# Patient Record
Sex: Male | Born: 1935 | Race: White | Hispanic: No | Marital: Single | Smoking: Never smoker
Health system: Southern US, Community
[De-identification: ages and names within clinical notes are randomized; demographics above are authoritative.]

## PROBLEM LIST (undated history)

## (undated) DIAGNOSIS — E119 Type 2 diabetes mellitus without complications: Secondary | ICD-10-CM

## (undated) DIAGNOSIS — C9110 Chronic lymphocytic leukemia of B-cell type not having achieved remission: Secondary | ICD-10-CM

## (undated) DIAGNOSIS — I10 Essential (primary) hypertension: Secondary | ICD-10-CM

## (undated) HISTORY — PX: TONSILLECTOMY: SUR1361

---

## 2003-12-17 ENCOUNTER — Ambulatory Visit: Payer: Self-pay | Admitting: Internal Medicine

## 2004-03-17 ENCOUNTER — Ambulatory Visit: Payer: Self-pay | Admitting: Internal Medicine

## 2004-06-18 ENCOUNTER — Ambulatory Visit: Payer: Self-pay | Admitting: Internal Medicine

## 2004-09-22 ENCOUNTER — Ambulatory Visit: Payer: Self-pay | Admitting: Internal Medicine

## 2005-01-20 ENCOUNTER — Ambulatory Visit: Payer: Self-pay | Admitting: Internal Medicine

## 2005-07-13 ENCOUNTER — Ambulatory Visit: Payer: Self-pay | Admitting: Internal Medicine

## 2005-07-22 LAB — CBC WITH DIFFERENTIAL/PLATELET
BASO%: 0.5 % (ref 0.0–2.0)
EOS%: 0.2 % (ref 0.0–7.0)
HCT: 42.8 % (ref 38.7–49.9)
MCH: 31.9 pg (ref 28.0–33.4)
MCHC: 33.8 g/dL (ref 32.0–35.9)
NEUT%: 21.3 % — ABNORMAL LOW (ref 40.0–75.0)
RBC: 4.53 10*6/uL (ref 4.20–5.71)
RDW: 14.5 % (ref 11.2–14.6)
lymph#: 17.3 10*3/uL — ABNORMAL HIGH (ref 0.9–3.3)

## 2006-01-15 ENCOUNTER — Ambulatory Visit: Payer: Self-pay | Admitting: Internal Medicine

## 2006-01-20 LAB — CBC WITH DIFFERENTIAL/PLATELET
EOS%: 0.2 % (ref 0.0–7.0)
Eosinophils Absolute: 0 10*3/uL (ref 0.0–0.5)
MCV: 95.8 fL (ref 81.6–98.0)
MONO%: 5.3 % (ref 0.0–13.0)
NEUT#: 5.1 10*3/uL (ref 1.5–6.5)
RBC: 4.5 10*6/uL (ref 4.20–5.71)
RDW: 13.3 % (ref 11.2–14.6)

## 2006-01-20 LAB — LACTATE DEHYDROGENASE: LDH: 102 U/L (ref 94–250)

## 2006-01-20 LAB — TECHNOLOGIST REVIEW

## 2016-10-16 ENCOUNTER — Emergency Department: Payer: Medicare Other

## 2016-10-16 ENCOUNTER — Emergency Department
Admission: EM | Admit: 2016-10-16 | Discharge: 2016-10-16 | Disposition: A | Discharge status: Home / Self Care | Payer: Medicare Other | Attending: Emergency Medicine | Admitting: Emergency Medicine

## 2016-10-16 ENCOUNTER — Encounter: Payer: Self-pay | Admitting: Emergency Medicine

## 2016-10-16 DIAGNOSIS — Y929 Unspecified place or not applicable: Secondary | ICD-10-CM | POA: Insufficient documentation

## 2016-10-16 DIAGNOSIS — S32010A Wedge compression fracture of first lumbar vertebra, initial encounter for closed fracture: Secondary | ICD-10-CM | POA: Diagnosis not present

## 2016-10-16 DIAGNOSIS — X500XXA Overexertion from strenuous movement or load, initial encounter: Secondary | ICD-10-CM | POA: Diagnosis not present

## 2016-10-16 DIAGNOSIS — Y999 Unspecified external cause status: Secondary | ICD-10-CM | POA: Insufficient documentation

## 2016-10-16 DIAGNOSIS — S3992XA Unspecified injury of lower back, initial encounter: Secondary | ICD-10-CM | POA: Diagnosis present

## 2016-10-16 DIAGNOSIS — Y9389 Activity, other specified: Secondary | ICD-10-CM | POA: Insufficient documentation

## 2016-10-16 DIAGNOSIS — Z9181 History of falling: Secondary | ICD-10-CM | POA: Diagnosis not present

## 2016-10-16 DIAGNOSIS — E119 Type 2 diabetes mellitus without complications: Secondary | ICD-10-CM | POA: Diagnosis not present

## 2016-10-16 DIAGNOSIS — I10 Essential (primary) hypertension: Secondary | ICD-10-CM | POA: Diagnosis not present

## 2016-10-16 DIAGNOSIS — F0390 Unspecified dementia without behavioral disturbance: Secondary | ICD-10-CM | POA: Diagnosis not present

## 2016-10-16 HISTORY — DX: Type 2 diabetes mellitus without complications: E11.9

## 2016-10-16 HISTORY — DX: Essential (primary) hypertension: I10

## 2016-10-16 LAB — URINALYSIS, COMPLETE (UACMP) WITH MICROSCOPIC
Bacteria, UA: NONE SEEN
Bilirubin Urine: NEGATIVE
Glucose, UA: NEGATIVE mg/dL
Ketones, ur: NEGATIVE mg/dL
LEUKOCYTES UA: NEGATIVE
NITRITE: NEGATIVE
PH: 5 (ref 5.0–8.0)
Protein, ur: NEGATIVE mg/dL
SPECIFIC GRAVITY, URINE: 1.01 (ref 1.005–1.030)

## 2016-10-16 LAB — COMPREHENSIVE METABOLIC PANEL
ALBUMIN: 3.7 g/dL (ref 3.5–5.0)
ALK PHOS: 72 U/L (ref 38–126)
ALT: 15 U/L — ABNORMAL LOW (ref 17–63)
ANION GAP: 9 (ref 5–15)
AST: 23 U/L (ref 15–41)
BILIRUBIN TOTAL: 0.6 mg/dL (ref 0.3–1.2)
BUN: 28 mg/dL — ABNORMAL HIGH (ref 6–20)
CALCIUM: 8.9 mg/dL (ref 8.9–10.3)
CO2: 21 mmol/L — ABNORMAL LOW (ref 22–32)
Chloride: 102 mmol/L (ref 101–111)
Creatinine, Ser: 1.36 mg/dL — ABNORMAL HIGH (ref 0.61–1.24)
GFR, EST AFRICAN AMERICAN: 49 mL/min — AB (ref >60)
GFR, EST NON AFRICAN AMERICAN: 43 mL/min — AB (ref >60)
Glucose, Bld: 140 mg/dL — ABNORMAL HIGH (ref 65–99)
POTASSIUM: 5.1 mmol/L (ref 3.5–5.1)
Sodium: 132 mmol/L — ABNORMAL LOW (ref 135–145)
TOTAL PROTEIN: 6.7 g/dL (ref 6.5–8.1)

## 2016-10-16 NOTE — ED Triage Notes (Signed)
Pt reports that his lower back is hurt. He states that it hurts when he gets up from bed and moving positions. He reports that he did fall a few days ago.

## 2016-10-16 NOTE — Progress Notes (Signed)
Clinical Education officer, museum (CSW) received verbal consult from Dr. Corky Downs to speak to patient and his family about placement options. Per Dr. Corky Downs patient will D/C home from the ED. CSW attempted to meet with patient and his family however they had already left before CSW could meet with them. RN case manager aware of above.   [employer], LCSW [phone]

## 2016-10-16 NOTE — ED Provider Notes (Signed)
[facility] Emergency Department Provider Note   ____________________________________________    I have reviewed the triage vital signs and the nursing notes.   HISTORY  Chief Complaint Back Pain   History limited by mild dementia, some history provided by family  HPI Frank Owens is a 83 y.o. male Who presents with complaints of back pain.patient reports her last 2-3 days he has had pain in his right lower back he thinks he may have pulled a muscle lifting something heavy. He reports he did have a fall over a week ago but the pain started 2-3 days ago. He has taken nothing for this. No dysuria, no incontinence. No focal deficits. No abdominal pain nausea or vomiting. He does not know his medical history.    Past Medical History:  Diagnosis Date  . Diabetes mellitus without complication ([place])   . Hypertension     There are no active problems to display for this patient.   Past Surgical History:  Procedure Laterality Date  . TONSILLECTOMY      Prior to Admission medications   Not on File     Allergies Patient has no known allergies.  History reviewed. No pertinent family history.  Social History Social History  Substance Use Topics  . Smoking status: Never Smoker  . Smokeless tobacco: Not on file  . Alcohol use No    Review of Systems  Constitutional: No fever/chills Eyes: No visual changes.  ENT: No neck pain Cardiovascular: Denies chest pain. Respiratory: Denies shortness of breath. Gastrointestinal: No abdominal pain.   Genitourinary: Negative for dysuria. Musculoskeletal: as above. Skin: Negative for rash. Neurological: Negative for focal weakness   ____________________________________________   PHYSICAL EXAM:  VITAL SIGNS: ED Triage Vitals  Enc Vitals Group     BP 10/16/16 1105 114/61     Pulse Rate 10/16/16 1230 (!) 59     Resp 10/16/16 1105 20     Temp 10/16/16 1105 97.8 F (36.6 C)     Temp Source  10/16/16 1105 Oral     SpO2 10/16/16 1105 100 %     Weight 10/16/16 1106 79.4 kg (175 lb)     Height 10/16/16 1106 1.727 m (5\' 8" )     Head Circumference --      Peak Flow --      Pain Score 10/16/16 1105 5     Pain Loc --      Pain Edu? --      Excl. in [place]? --     Constitutional: Alert. No acute distress.  Eyes: Conjunctivae are normal.  Head: Atraumatic.  Mouth/Throat: Mucous membranes are moist.   Neck:  Painless ROM Cardiovascular: Normal rate, regular rhythm.   Good peripheral circulation. Respiratory: Normal respiratory effort.  No retractions.  Gastrointestinal: Soft and nontender. No distention.  No CVA tenderness. Genitourinary: deferred Musculoskeletal: Back: mild paraspinal ttp right lower back, point ttp in this location.  Warm and well perfused Neurologic:  Normal speech and language. No gross focal neurologic deficits are appreciated.  Normal strength in the lower extremity.  Skin:  Skin is warm, dry and intact. No rash noted. Psychiatric: Mood and affect are normal. Speech and behavior are normal.  ____________________________________________   LABS (all labs ordered are listed, but only abnormal results are displayed)  Labs Reviewed  CBC WITH DIFFERENTIAL/PLATELET - Abnormal; Notable for the following:       Result Value   WBC 114.0 (*)    RBC 3.94 (*)  Hemoglobin 12.7 (*)    HCT 38.8 (*)    Neutro Abs 1.1 (*)    Lymphs Abs 112.9 (*)    Monocytes Absolute 0.0 (*)    All other components within normal limits  COMPREHENSIVE METABOLIC PANEL - Abnormal; Notable for the following:    Sodium 132 (*)    CO2 21 (*)    Glucose, Bld 140 (*)    BUN 28 (*)    Creatinine, Ser 1.36 (*)    ALT 15 (*)    GFR calc non Af Amer 43 (*)    GFR calc Af Amer 49 (*)    All other components within normal limits  URINALYSIS, COMPLETE (UACMP) WITH MICROSCOPIC - Abnormal; Notable for the following:    Color, Urine YELLOW (*)    APPearance CLEAR (*)    Hgb urine dipstick  MODERATE (*)    Squamous Epithelial / LPF 0-5 (*)    All other components within normal limits   ____________________________________________  EKG   ____________________________________________  RADIOLOGY  L1 compression fx ____________________________________________   PROCEDURES  Procedure(s) performed: No    Critical Care performed: No ____________________________________________   INITIAL IMPRESSION / ASSESSMENT AND PLAN / ED COURSE  Pertinent labs & imaging results that were available during my care of the patient were reviewed by me and considered in my medical decision making (see chart for details).  Patient well appearing and in no acute distress. Markedly elevated wbc. D/W PCP who notes hx of CLL >10 years. decision to not treat by family. Do not feel this is related to presentation today.   Diff dx: MS back pain, uti, fx  Patient with mild dementia apparently lives alone with heavy family support. Case manager spoke with family regarding home health which they declined.     ____________________________________________   FINAL CLINICAL IMPRESSION(S) / ED DIAGNOSES  Final diagnoses:  Closed compression fracture of first lumbar vertebra, initial encounter ([place])      NEW MEDICATIONS STARTED DURING THIS VISIT:  There are no discharge medications for this patient.    Note:  This document was prepared using Dragon voice recognition software and may include unintentional dictation errors.    Lavonia Drafts, MD 10/16/16 [phone]

## 2016-10-16 NOTE — Care Management Note (Signed)
Case Management Note  Patient Details  Name: TALVIN CHRISTIANSON MRN: [id number] Date of Birth: Apr 14, 1919  Subjective/Objective:   Asked by Dr Corky Downs to see the patient and family who are at bedside. After getting permission from the aptient to talk about his care in fron t of the family I proceeded to,let them know the MD had asked me to  Talk to them about HH P.T. And a Education officer, museum. The family, especially the nieces , are strongly against this. They say they have been looking into options for placing the patient as he is unable to care for himself. I explained that the PT eval. Might be helpful in establishing level of function, as well as offering some therapy for the patient and they have declined. They are aware according to them of the fact that a personal care service would cost out of pocket. They are adamant that they know which  Facility they want him to go to, and will continue to work toward getting the patient placed there themselves. I have also explained that having a [place] CSW could help them, and at this point they want to go home.  I have advised Dr Corky Downs of the conversation. The patient will be discharged.                Action/Plan:   Expected Discharge Date:                  Expected Discharge Plan:     In-House Referral:     Discharge planning Services     Post Acute Care Choice:    Choice offered to:     DME Arranged:    DME Agency:     HH Arranged:    HH Agency:     Status of Service:     If discussed at [employer] of Stay Meetings, dates discussed:    Additional Comments:  Beau Fanny, RN 10/16/2016, 2:13 PM

## 2016-10-23 LAB — CBC WITH DIFFERENTIAL/PLATELET
BASOS PCT: 0 %
Basophils Absolute: 0 10*3/uL (ref 0–0.1)
EOS ABS: 0 10*3/uL (ref 0–0.7)
EOS PCT: 0 %
HEMATOCRIT: 38.8 % — AB (ref 40.0–52.0)
Hemoglobin: 12.7 g/dL — ABNORMAL LOW (ref 13.0–18.0)
LYMPHS PCT: 99 %
Lymphs Abs: 112.9 10*3/uL — ABNORMAL HIGH (ref 1.0–3.6)
MCH: 32.1 pg (ref 26.0–34.0)
MCHC: 32.7 g/dL (ref 32.0–36.0)
MCV: 98.3 fL (ref 80.0–100.0)
Monocytes Absolute: 0 10*3/uL — ABNORMAL LOW (ref 0.2–1.0)
Monocytes Relative: 0 %
NEUTROS ABS: 1.1 10*3/uL — AB (ref 1.4–6.5)
Neutrophils Relative %: 1 %
Platelets: 194 10*3/uL (ref 150–440)
RBC: 3.94 MIL/uL — ABNORMAL LOW (ref 4.40–5.90)
RDW: 14.3 % (ref 11.5–14.5)
WBC: 114 10*3/uL (ref 4.0–10.5)

## 2016-10-31 ENCOUNTER — Emergency Department (HOSPITAL_COMMUNITY): Payer: Medicare Other

## 2016-10-31 ENCOUNTER — Inpatient Hospital Stay (HOSPITAL_COMMUNITY)
Admission: EM | Admit: 2016-10-31 | Discharge: 2016-11-04 | DRG: 309 | Disposition: A | Discharge status: Skilled Nursing Facility | Payer: Medicare Other | Attending: Internal Medicine | Admitting: Internal Medicine

## 2016-10-31 ENCOUNTER — Encounter (HOSPITAL_COMMUNITY): Payer: Self-pay

## 2016-10-31 DIAGNOSIS — L8990 Pressure ulcer of unspecified site, unspecified stage: Secondary | ICD-10-CM | POA: Insufficient documentation

## 2016-10-31 DIAGNOSIS — D72820 Lymphocytosis (symptomatic): Secondary | ICD-10-CM | POA: Diagnosis not present

## 2016-10-31 DIAGNOSIS — C9110 Chronic lymphocytic leukemia of B-cell type not having achieved remission: Secondary | ICD-10-CM | POA: Diagnosis present

## 2016-10-31 DIAGNOSIS — N183 Chronic kidney disease, stage 3 (moderate): Secondary | ICD-10-CM | POA: Diagnosis present

## 2016-10-31 DIAGNOSIS — E1122 Type 2 diabetes mellitus with diabetic chronic kidney disease: Secondary | ICD-10-CM | POA: Diagnosis present

## 2016-10-31 DIAGNOSIS — R001 Bradycardia, unspecified: Secondary | ICD-10-CM | POA: Diagnosis not present

## 2016-10-31 DIAGNOSIS — I10 Essential (primary) hypertension: Secondary | ICD-10-CM | POA: Diagnosis not present

## 2016-10-31 DIAGNOSIS — T447X5A Adverse effect of beta-adrenoreceptor antagonists, initial encounter: Secondary | ICD-10-CM | POA: Diagnosis present

## 2016-10-31 DIAGNOSIS — I495 Sick sinus syndrome: Secondary | ICD-10-CM | POA: Diagnosis not present

## 2016-10-31 DIAGNOSIS — I129 Hypertensive chronic kidney disease with stage 1 through stage 4 chronic kidney disease, or unspecified chronic kidney disease: Secondary | ICD-10-CM | POA: Diagnosis present

## 2016-10-31 DIAGNOSIS — T461X5A Adverse effect of calcium-channel blockers, initial encounter: Secondary | ICD-10-CM | POA: Diagnosis present

## 2016-10-31 DIAGNOSIS — E875 Hyperkalemia: Secondary | ICD-10-CM | POA: Diagnosis present

## 2016-10-31 DIAGNOSIS — E785 Hyperlipidemia, unspecified: Secondary | ICD-10-CM | POA: Diagnosis present

## 2016-10-31 DIAGNOSIS — N179 Acute kidney failure, unspecified: Secondary | ICD-10-CM | POA: Diagnosis present

## 2016-10-31 DIAGNOSIS — Z66 Do not resuscitate: Secondary | ICD-10-CM | POA: Diagnosis present

## 2016-10-31 DIAGNOSIS — C9190 Lymphoid leukemia, unspecified not having achieved remission: Secondary | ICD-10-CM | POA: Diagnosis not present

## 2016-10-31 HISTORY — DX: Chronic lymphocytic leukemia of B-cell type not having achieved remission: C91.10

## 2016-10-31 LAB — CBC WITH DIFFERENTIAL/PLATELET
BASOS ABS: 0 10*3/uL (ref 0.0–0.1)
Basophils Relative: 0 %
EOS PCT: 0 %
Eosinophils Absolute: 0 10*3/uL (ref 0.0–0.7)
HEMATOCRIT: 36.4 % — AB (ref 39.0–52.0)
Hemoglobin: 11.3 g/dL — ABNORMAL LOW (ref 13.0–17.0)
Lymphocytes Relative: 93 %
Lymphs Abs: 159.1 10*3/uL — ABNORMAL HIGH (ref 0.7–4.0)
MCH: 31.2 pg (ref 26.0–34.0)
MCHC: 31 g/dL (ref 30.0–36.0)
MCV: 100.6 fL — AB (ref 78.0–100.0)
MONOS PCT: 2 %
Monocytes Absolute: 3.4 10*3/uL — ABNORMAL HIGH (ref 0.1–1.0)
NEUTROS PCT: 5 %
Neutro Abs: 8.6 10*3/uL — ABNORMAL HIGH (ref 1.7–7.7)
Platelets: 239 10*3/uL (ref 150–400)
RBC: 3.62 MIL/uL — AB (ref 4.22–5.81)
RDW: 14.5 % (ref 11.5–15.5)
WBC: 171.1 10*3/uL — AB (ref 4.0–10.5)

## 2016-10-31 LAB — COMPREHENSIVE METABOLIC PANEL
ALT: 14 U/L — AB (ref 17–63)
AST: 36 U/L (ref 15–41)
Albumin: 3.1 g/dL — ABNORMAL LOW (ref 3.5–5.0)
Alkaline Phosphatase: 82 U/L (ref 38–126)
Anion gap: 11 (ref 5–15)
BILIRUBIN TOTAL: 1.2 mg/dL (ref 0.3–1.2)
BUN: 34 mg/dL — ABNORMAL HIGH (ref 6–20)
CALCIUM: 8 mg/dL — AB (ref 8.9–10.3)
CO2: 21 mmol/L — ABNORMAL LOW (ref 22–32)
CREATININE: 2.15 mg/dL — AB (ref 0.61–1.24)
Chloride: 96 mmol/L — ABNORMAL LOW (ref 101–111)
GFR calc Af Amer: 28 mL/min — ABNORMAL LOW (ref >60)
GFR, EST NON AFRICAN AMERICAN: 24 mL/min — AB (ref >60)
Glucose, Bld: 184 mg/dL — ABNORMAL HIGH (ref 65–99)
Sodium: 128 mmol/L — ABNORMAL LOW (ref 135–145)
TOTAL PROTEIN: 5.5 g/dL — AB (ref 6.5–8.1)

## 2016-10-31 LAB — BASIC METABOLIC PANEL
Anion gap: 8 (ref 5–15)
BUN: 36 mg/dL — ABNORMAL HIGH (ref 6–20)
CALCIUM: 8.2 mg/dL — AB (ref 8.9–10.3)
CHLORIDE: 99 mmol/L — AB (ref 101–111)
CO2: 22 mmol/L (ref 22–32)
Creatinine, Ser: 1.96 mg/dL — ABNORMAL HIGH (ref 0.61–1.24)
GFR calc non Af Amer: 27 mL/min — ABNORMAL LOW (ref >60)
GFR, EST AFRICAN AMERICAN: 32 mL/min — AB (ref >60)
GLUCOSE: 166 mg/dL — AB (ref 65–99)
Potassium: 5.7 mmol/L — ABNORMAL HIGH (ref 3.5–5.1)
Sodium: 129 mmol/L — ABNORMAL LOW (ref 135–145)

## 2016-10-31 LAB — I-STAT VENOUS BLOOD GAS, ED
ACID-BASE DEFICIT: 1 mmol/L (ref 0.0–2.0)
BICARBONATE: 23.4 mmol/L (ref 20.0–28.0)
O2 SAT: 73 %
PO2 VEN: 39 mmHg (ref 32.0–45.0)
TCO2: 25 mmol/L (ref 22–32)
pCO2, Ven: 39 mmHg — ABNORMAL LOW (ref 44.0–60.0)
pH, Ven: 7.386 (ref 7.250–7.430)

## 2016-10-31 LAB — I-STAT TROPONIN, ED: Troponin i, poc: 0.04 ng/mL (ref 0.00–0.08)

## 2016-10-31 LAB — I-STAT CHEM 8, ED
BUN: 40 mg/dL — ABNORMAL HIGH (ref 6–20)
CREATININE: 2.2 mg/dL — AB (ref 0.61–1.24)
Calcium, Ion: 1.01 mmol/L — ABNORMAL LOW (ref 1.15–1.40)
Chloride: 98 mmol/L — ABNORMAL LOW (ref 101–111)
GLUCOSE: 189 mg/dL — AB (ref 65–99)
HCT: 35 % — ABNORMAL LOW (ref 39.0–52.0)
HEMOGLOBIN: 11.9 g/dL — AB (ref 13.0–17.0)
Potassium: 7.5 mmol/L (ref 3.5–5.1)
Sodium: 129 mmol/L — ABNORMAL LOW (ref 135–145)
TCO2: 23 mmol/L (ref 22–32)

## 2016-10-31 LAB — TROPONIN I: TROPONIN I: 0.04 ng/mL — AB (ref <0.03)

## 2016-10-31 MED ORDER — ATROPINE SULFATE 1 MG/10ML IJ SOSY
0.5000 mg | PREFILLED_SYRINGE | INTRAMUSCULAR | Status: DC | PRN
  Administered 2016-10-31: 0.5 mg via INTRAVENOUS
  Filled 2016-10-31: qty 10

## 2016-10-31 MED ORDER — DEXTROSE 10 % IV SOLN
Freq: Once | INTRAVENOUS | Status: AC
  Administered 2016-10-31: 20:00:00 via INTRAVENOUS

## 2016-10-31 MED ORDER — ACETAMINOPHEN 650 MG RE SUPP: 650.0000 mg | Freq: Four times a day (QID) | RECTAL | Status: DC | PRN

## 2016-10-31 MED ORDER — CALCIUM GLUCONATE 10 % IV SOLN
1.0000 g | Freq: Once | INTRAVENOUS | Status: AC
  Administered 2016-10-31: 1 g via INTRAVENOUS
  Filled 2016-10-31: qty 10

## 2016-10-31 MED ORDER — FENTANYL CITRATE (PF) 100 MCG/2ML IJ SOLN
50.0000 ug | Freq: Once | INTRAMUSCULAR | Status: AC
  Administered 2016-10-31: 50 ug via INTRAVENOUS
  Filled 2016-10-31: qty 2

## 2016-10-31 MED ORDER — ONDANSETRON HCL 4 MG PO TABS
4.0000 mg | ORAL_TABLET | Freq: Four times a day (QID) | ORAL | Status: DC | PRN
  Filled 2016-10-31: qty 1

## 2016-10-31 MED ORDER — SODIUM CHLORIDE 0.9 % IV BOLUS (SEPSIS)
1000.0000 mL | Freq: Once | INTRAVENOUS | Status: AC
  Administered 2016-11-01: 1000 mL via INTRAVENOUS

## 2016-10-31 MED ORDER — ONDANSETRON HCL 4 MG/2ML IJ SOLN: 4.0000 mg | Freq: Four times a day (QID) | INTRAMUSCULAR | Status: DC | PRN

## 2016-10-31 MED ORDER — ACETAMINOPHEN 325 MG PO TABS: 650.0000 mg | ORAL_TABLET | Freq: Four times a day (QID) | ORAL | Status: DC | PRN

## 2016-10-31 MED ORDER — HEPARIN SODIUM (PORCINE) 5000 UNIT/ML IJ SOLN
5000.0000 [IU] | Freq: Three times a day (TID) | INTRAMUSCULAR | Status: DC
  Administered 2016-10-31 – 2016-11-04 (×11): 5000 [IU] via SUBCUTANEOUS
  Filled 2016-10-31 (×10): qty 1

## 2016-10-31 MED ORDER — SODIUM CHLORIDE 0.9 % IV SOLN
INTRAVENOUS | Status: DC
  Administered 2016-10-31 – 2016-11-03 (×6): via INTRAVENOUS

## 2016-10-31 MED ORDER — INSULIN ASPART 100 UNIT/ML IV SOLN
10.0000 [IU] | Freq: Once | INTRAVENOUS | Status: AC
  Administered 2016-10-31: 10 [IU] via INTRAVENOUS
  Filled 2016-10-31: qty 0.1

## 2016-10-31 NOTE — H&P (Signed)
History and Physical    Frank Owens PXT:[id number] DOB: 04/29/1919 DOA: 10/31/2016  PCP: [facility], [place]  Patient coming from: Home  I have personally briefly reviewed patient's old medical records in [place]  Chief Complaint: Found unresponsive  HPI: Frank Owens is a 83 y.o. male with medical history significant of CLL, HTN, "nervous breakdown" when younger.  Patient lives alone at baseline but requires significant care per family.  They visited at least 2-3 times a day for feedings, medications, assistance with ADLs.  Earlier patient was in usual state of health for breakfast.  Family came back later, sleeping in recliner.  Came back 1 hr later and he was unresponsive.  EMS called, on arrival HR in 4s.  No BP noted.  Transcutaneous pacing started and patient became more alert.  Transported to ER.   ED Course: Given 1 dose of atropine in ER.  Cards called.  Transcutaneous pacing stopped and patient with rate of ~60 still.  Work up in [place] showed hyperkalemia with initial K of 7.5 improved to 5.7 after temporary measures given, no QRS widening, no peaked T waves on EKG.  Also found to have AKI with creat of 2 up from 1.3 just earlier this month.  WBC is 171k, up from 114k earlier this month.  He does have known h/o CLL.    Review of Systems: As per HPI otherwise 10 point review of systems negative.   Past Medical History:  Diagnosis Date  . CLL (chronic lymphocytic leukemia) ([place])   . Diabetes mellitus without complication ([place])   . Hypertension     Past Surgical History:  Procedure Laterality Date  . TONSILLECTOMY       reports that he has never smoked. He has never used smokeless tobacco. He reports that he does not drink alcohol or use drugs.  No Known Allergies  History reviewed. No pertinent family history.   Prior to Admission medications   Medication Sig Start Date End Date Taking? Authorizing Provider  atorvastatin (LIPITOR)  20 MG tablet Take 20 mg by mouth daily. 10/23/16  Yes [provider]  carvedilol (COREG) 25 MG tablet Take 25 mg by mouth 2 (two) times daily with a meal.  09/21/16  Yes [provider]  diazepam (VALIUM) 5 MG tablet Take 5 mg by mouth daily. 09/09/16  Yes [provider]  diltiazem (TIAZAC) 240 MG 24 hr capsule Take 240 mg by mouth daily. 09/21/16  Yes [provider]  polyethylene glycol powder (GLYCOLAX/MIRALAX) powder Take 3,350 g by mouth daily as needed. 10/20/16  Yes [provider]    Physical Exam: Vitals:   10/31/16 2230 10/31/16 2245 10/31/16 2300 10/31/16 2315  BP: (!) 136/58 (!) 138/58 (!) 142/55 (!) 144/58  Pulse: (!) 58 (!) 58 (!) 58 62  Resp: 19 19 19 20   Temp:      TempSrc:      SpO2: 96% 97% 97% 97%  Weight:      Height:        Constitutional: NAD, calm, comfortable Eyes: PERRL, lids and conjunctivae normal ENMT: Mucous membranes are moist. Posterior pharynx clear of any exudate or lesions.Normal dentition.  Hard of hearing Neck: normal, supple, no masses, no thyromegaly Respiratory: clear to auscultation bilaterally, no wheezing, no crackles. Normal respiratory effort. No accessory muscle use.  Cardiovascular: Regular rate and rhythm, no murmurs / rubs / gallops. No extremity edema. 2+ pedal pulses. No carotid bruits.  Abdomen: no tenderness,  no masses palpated. No hepatosplenomegaly. Bowel sounds positive.  Musculoskeletal: no clubbing / cyanosis. No joint deformity upper and lower extremities. Good ROM, no contractures. Normal muscle tone.  Skin: no rashes, lesions, ulcers. No induration Neurologic: CN 2-12 grossly intact. Sensation intact, DTR normal. Strength 5/5 in all 4.  Psychiatric: Normal judgment and insight. Alert and oriented x 3. Normal mood.    Labs on Admission: I have personally reviewed following labs and imaging studies  CBC:  Recent Labs Lab 10/31/16 1925 10/31/16 1935  WBC 171.1*  --   NEUTROABS  8.6*  --   HGB 11.3* 11.9*  HCT 36.4* 35.0*  MCV 100.6*  --   PLT 239  --    Basic Metabolic Panel:  Recent Labs Lab 10/31/16 1925 10/31/16 1935 10/31/16 2145  NA 128* 129* 129*  K NOT CALCULATED 7.5* 5.7*  CL 96* 98* 99*  CO2 21*  --  22  GLUCOSE 184* 189* 166*  BUN 34* 40* 36*  CREATININE 2.15* 2.20* 1.96*  CALCIUM 8.0*  --  8.2*   GFR: Estimated Creatinine Clearance: 21.8 mL/min (A) (by C-G formula based on SCr of 1.96 mg/dL (H)). Liver Function Tests:  Recent Labs Lab 10/31/16 1925  AST 36  ALT 14*  ALKPHOS 82  BILITOT 1.2  PROT 5.5*  ALBUMIN 3.1*   No results for input(s): LIPASE, AMYLASE in the last 168 hours. No results for input(s): AMMONIA in the last 168 hours. Coagulation Profile: No results for input(s): INR, PROTIME in the last 168 hours. Cardiac Enzymes:  Recent Labs Lab 10/31/16 1925  TROPONINI 0.04*   BNP (last 3 results) No results for input(s): PROBNP in the last 8760 hours. HbA1C: No results for input(s): HGBA1C in the last 72 hours. CBG: No results for input(s): GLUCAP in the last 168 hours. Lipid Profile: No results for input(s): CHOL, HDL, LDLCALC, TRIG, CHOLHDL, LDLDIRECT in the last 72 hours. Thyroid Function Tests: No results for input(s): TSH, T4TOTAL, FREET4, T3FREE, THYROIDAB in the last 72 hours. Anemia Panel: No results for input(s): VITAMINB12, FOLATE, FERRITIN, TIBC, IRON, RETICCTPCT in the last 72 hours. Urine analysis:    Component Value Date/Time   COLORURINE YELLOW (A) 10/16/2016 1226   APPEARANCEUR CLEAR (A) 10/16/2016 1226   LABSPEC 1.010 10/16/2016 1226   PHURINE 5.0 10/16/2016 1226   GLUCOSEU NEGATIVE 10/16/2016 1226   HGBUR MODERATE (A) 10/16/2016 1226   BILIRUBINUR NEGATIVE 10/16/2016 1226   KETONESUR NEGATIVE 10/16/2016 1226   PROTEINUR NEGATIVE 10/16/2016 1226   NITRITE NEGATIVE 10/16/2016 1226   LEUKOCYTESUR NEGATIVE 10/16/2016 1226    Radiological Exams on Admission: Dg Chest Port 1  View  Result Date: 10/31/2016 CLINICAL DATA:  Acute mental status change. EXAM: PORTABLE CHEST 1 VIEW COMPARISON:  None. FINDINGS: No pneumothorax. The lateral left lung base is obscured by a transcutaneous pacer. The heart is borderline. The hila are symmetric. A torturous thoracic aorta is identified. No mediastinal abnormalities are noted. No nodules, masses, or focal infiltrates. IMPRESSION: No active disease. Electronically Signed   By: Dorise Bullion III M.D   On: 10/31/2016 19:47    EKG: Independently reviewed.  Assessment/Plan Principal Problem:   Bradycardia Active Problems:   Hyperkalemia   AKI (acute kidney injury) ([place])   CLL (chronic lymphocytic leukemia) ([facility])   HTN (hypertension)    1. Bradycardia - 1. Cards consult in chart 2. Treat hyperkalemia 3. Hold coreg and diltiazem 4. Tele monitor 2. CLL - 1. Uric acid, LDH pending to check for tumor  lysis syndrome 3. AKI - 1. Strict intake and output 2. Repeat BMP in AM 3. IVF: 1L NS bolus now and 125 cc/hr 4. US renal 4. Hyperkalemia - 1. Temp measures done in ED 2. Potassium levels Q4H 3. NS and AKI treatment as above 4. Tele monitor 5. HTN - 1. Holding home meds due to bradycardia 6. PT and OT consults for evaluation given need for assistance with ADLs  DVT prophylaxis: Heparin [place] Code Status: Full code per patient Family Communication: Family not in room Disposition Plan: TBD Consults called: Cards already seen patient Admission status: Admit to inpatient   [place], [place] [facility] Pager [phone]  If 7AM-7PM, please contact day team taking care of patient [email or web address] Password [email or web address]  10/31/2016, 11:38 PM

## 2016-10-31 NOTE — ED Provider Notes (Signed)
[facility] EMERGENCY DEPARTMENT Provider Note  CSN: [id number] Arrival date & time: 10/31/16 1911  Chief Complaint(s) No chief complaint on file.  HPI Frank Owens is a 83 y.o. male with a history of hypertension and diabetes who presents to the emergency department for altered mental status.  Patient was found at home by family on the floor and incoherent.  Last seen normal was earlier this morning around breakfast time.  The patient lives at home alone.  Family called EMS around dinnertime when they return to the patient's home and found him altered.  When EMS arrived they noted that the patient was disoriented and had a heart rate in the 30s.  They immediately placed at 60 bpm.  CBG of 250s.  They were unable to obtain an EKG due to immediate pacing.  Patient mental status improved shortly after patient was initiated.  He remained hemodynamically stable in route.  Patient does not remember the episode.  States that he "just passed out."  Denied any chest pain, shortness of breath, palpitations.  He is not on any anticoagulation.  The patient does take Coreg and diltiazem but denies taking too many of the medications.  Currently only endorsing discomfort from the external pacing.  HPI  Past Medical History Past Medical History:  Diagnosis Date  . CLL (chronic lymphocytic leukemia) ([place])   . Diabetes mellitus without complication ([place])   . Hypertension    Patient Active Problem List   Diagnosis Date Noted  . Hyperkalemia 10/31/2016  . AKI (acute kidney injury) ([place]) 10/31/2016  . CLL (chronic lymphocytic leukemia) ([place]) 10/31/2016  . HTN (hypertension) 10/31/2016  . Bradycardia    Home Medication(s) Prior to Admission medications   Medication Sig Start Date End Date Taking? Authorizing Provider  atorvastatin (LIPITOR) 20 MG tablet Take 20 mg by mouth daily. 10/23/16  Yes [provider]  carvedilol (COREG) 25 MG tablet Take 25 mg by mouth 2 (two)  times daily with a meal.  09/21/16  Yes [provider]  diazepam (VALIUM) 5 MG tablet Take 5 mg by mouth daily. 09/09/16  Yes [provider]  diltiazem (TIAZAC) 240 MG 24 hr capsule Take 240 mg by mouth daily. 09/21/16  Yes [provider]  polyethylene glycol powder (GLYCOLAX/MIRALAX) powder Take 3,350 g by mouth daily as needed. 10/20/16  Yes [provider]                                                                                                                                    Past Surgical History Past Surgical History:  Procedure Laterality Date  . TONSILLECTOMY     Family History History reviewed. No pertinent family history.  Social History Social History  Substance Use Topics  . Smoking status: Never Smoker  . Smokeless tobacco: Never Used  . Alcohol use No   Allergies Patient has no known allergies.  Review of  Systems Review of Systems All other systems are reviewed and are negative for acute change except as noted in the HPI  Physical Exam Vital Signs  I have reviewed the triage vital signs BP (!) 104/52   Pulse (!) 35  Resp 20   Ht 5\' 8"  (1.727 m)   Wt 79.4 kg (175 lb)   SpO2 99%   BMI 26.61 kg/m   Physical Exam  Constitutional: He is oriented to person, place, and time. He appears well-developed and well-nourished. No distress.  HENT:  Head: Normocephalic and atraumatic.  Nose: Nose normal.  Eyes: Pupils are equal, round, and reactive to light. Conjunctivae and EOM are normal. Right eye exhibits no discharge. Left eye exhibits no discharge. No scleral icterus.  Neck: Normal range of motion. Neck supple.  Cardiovascular: Regular rhythm.  Bradycardia present.  Exam reveals no gallop and no friction rub.   No murmur heard. Pacing discontinued and transitioned to our device.   Pulmonary/Chest: Effort normal and breath sounds normal. No stridor. No respiratory distress. He has no rales.  Abdominal: Soft. He exhibits  no distension. There is no tenderness.  Musculoskeletal: He exhibits no edema or tenderness.  Neurological: He is alert and oriented to person, place, and time.  Skin: Skin is warm and dry. No rash noted. He is not diaphoretic. No erythema.  Psychiatric: He has a normal mood and affect.  Vitals reviewed.   ED Results and Treatments Labs (all labs ordered are listed, but only abnormal results are displayed) Labs Reviewed  CBC WITH DIFFERENTIAL/PLATELET - Abnormal; Notable for the following:       Result Value   WBC 171.1 (*)    RBC 3.62 (*)    Hemoglobin 11.3 (*)    HCT 36.4 (*)    MCV 100.6 (*)    Neutro Abs 8.6 (*)    Lymphs Abs 159.1 (*)    Monocytes Absolute 3.4 (*)    All other components within normal limits  COMPREHENSIVE METABOLIC PANEL - Abnormal; Notable for the following:    Sodium 128 (*)    Chloride 96 (*)    CO2 21 (*)    Glucose, Bld 184 (*)    BUN 34 (*)    Creatinine, Ser 2.15 (*)    Calcium 8.0 (*)    Total Protein 5.5 (*)    Albumin 3.1 (*)    ALT 14 (*)    GFR calc non Af Amer 24 (*)    GFR calc Af Amer 28 (*)    All other components within normal limits  TROPONIN I - Abnormal; Notable for the following:    Troponin I 0.04 (*)    All other components within normal limits  BASIC METABOLIC PANEL - Abnormal; Notable for the following:    Sodium 129 (*)    Potassium 5.7 (*)    Chloride 99 (*)    Glucose, Bld 166 (*)    BUN 36 (*)    Creatinine, Ser 1.96 (*)    Calcium 8.2 (*)    GFR calc non Af Amer 27 (*)    GFR calc Af Amer 32 (*)    All other components within normal limits  I-STAT CHEM 8, ED - Abnormal; Notable for the following:    Sodium 129 (*)    Potassium 7.5 (*)    Chloride 98 (*)    BUN 40 (*)    Creatinine, Ser 2.20 (*)    Glucose, Bld 189 (*)    Calcium,  Ion 1.01 (*)    Hemoglobin 11.9 (*)    HCT 35.0 (*)    All other components within normal limits  I-STAT VENOUS BLOOD GAS, ED - Abnormal; Notable for the following:    pCO2,  Ven 39.0 (*)    All other components within normal limits  URINALYSIS, ROUTINE W REFLEX MICROSCOPIC  BLOOD GAS, VENOUS  LACTATE DEHYDROGENASE  URIC ACID  I-STAT TROPONIN, ED                                                                                                                         EKG  EKG Interpretation  Date/Time:  Saturday October 31 2016 19:27:32 EDT Ventricular Rate:  37 PR Interval:    QRS Duration: 104 QT Interval:  487 QTC Calculation: 382 R Axis:   68 Text Interpretation:  Junctional rhythm NO STEMI No old tracing to compare Confirmed by Addison Lank [phone]) on 10/31/2016 8:17:42 PM      Radiology Dg Chest Port 1 View  Result Date: 10/31/2016 CLINICAL DATA:  Acute mental status change. EXAM: PORTABLE CHEST 1 VIEW COMPARISON:  None. FINDINGS: No pneumothorax. The lateral left lung base is obscured by a transcutaneous pacer. The heart is borderline. The hila are symmetric. A torturous thoracic aorta is identified. No mediastinal abnormalities are noted. No nodules, masses, or focal infiltrates. IMPRESSION: No active disease. Electronically Signed   By: Dorise Bullion III M.D   On: 10/31/2016 19:47   Pertinent labs & imaging results that were available during my care of the patient were reviewed by me and considered in my medical decision making (see chart for details).  Medications Ordered in ED Medications  atropine 1 MG/10ML injection 0.5 mg (0.5 mg Intravenous Given 10/31/16 1928)  0.9 %  sodium chloride infusion (not administered)  fentaNYL (SUBLIMAZE) injection 50 mcg (50 mcg Intravenous Given 10/31/16 1924)  calcium gluconate 1 g in sodium chloride 0.9 % 100 mL IVPB (0 g Intravenous Stopped 10/31/16 2015)  insulin aspart (novoLOG) injection 10 Units (10 Units Intravenous Given 10/31/16 2020)  dextrose 10 % infusion ( Intravenous New Bag/Given 10/31/16 2019)                                                                                                                                     Procedures Procedures CRITICAL CARE Performed by: Grayce Sessions Pedram Goodchild Total critical care time: 75 minutes  Critical care time was exclusive of separately billable procedures and treating other patients. Critical care was necessary to treat or prevent imminent or life-threatening deterioration. Critical care was time spent personally by me on the following activities: development of treatment plan with patient and/or surrogate as well as nursing, discussions with consultants, evaluation of patient's response to treatment, examination of patient, obtaining history from patient or surrogate, ordering and performing treatments and interventions, ordering and review of laboratory studies, ordering and review of radiographic studies, pulse oximetry and re-evaluation of patient's condition.  (including critical care time)  Medical Decision Making / ED Course I have reviewed the nursing notes for this encounter and the patient's prior records (if available in EHR or on provided paperwork).    Patient is bradycardic and currently being paced.  Hemodynamically stable while being paced.  Patient was transitioned from EMS transcutaneous pacing to our transcutaneous pacing.  In the interim we obtained a EKG which revealed junctional rhythm with no obvious P waves noted.  Patient was placed back on transcutaneous pacing and given atropine.  Patient's medication (Coreg, diltiazem, statin ) was with him and counted, no notable missing medication.  Patient is oriented x3.  Given fentanyl for pain from the transcutaneous pacing.  Screening labs obtained.  I-STAT Chem-8 revealed hyperkalemia at 7.5.  Though EKG does not reflect hyperkalemia, patient was given calcium and placed on insulin for precaution.  Formal CMP was unable to calculate the potassium due to large amount of hemolysis.  CBC with significant leukocytosis 171.   At this time family was at bedside and was able  to provide additional medical history including history of CLL not currently being treated.  Patient had a recent follow-up with Dr. Deforest Hoyles who is the patient's oncologist and reported worsening cell counts.  Family also reported that the patient has had poor p.o. intake and requires assistance from family to take medications and to provide him with meals.  Cardiology, Dr. Harrell Gave, was consulted who evaluated the patient in the emergency department.  During her evaluation, patient was able to be weaned off transcutaneous pacing and remained with heart rate in the 50s and hemodynamically stable.  Repeat BMP revealed improved potassium at 5.7.  Patient still with renal insufficiency, of unknown etiology but may include dehydration versus rhabdo vs  tumor lysis syndrome given the patient's significant leukocytosis and history of CLL.  LDH and uric acid added.   Cardiology did not feel that aggressive/invasive pacing was required at this time.  They will defer to medicine for further workup and management.  They will continue to follow along while the patient is admitted.  Please see their note for further recommendations.   Case discussed with Dr. Alcario Drought who will admit to SDU for further work up and management.  Final Clinical Impression(s) / ED Diagnoses Final diagnoses:  Bradycardia  AKI (acute kidney injury) ([place])  Hyperkalemia  Lymphocytosis      This chart was dictated using voice recognition software.  Despite best efforts to proofread,  errors can occur which can change the documentation meaning.   Fatima Blank, MD 10/31/16 [phone]

## 2016-10-31 NOTE — ED Notes (Signed)
Family at bedside. 

## 2016-10-31 NOTE — ED Triage Notes (Signed)
Pt arrived via [facility] found at home lethargic by family who called EMS last seen this morning.  Arrived externally paced HR at home 30bpm.

## 2016-10-31 NOTE — ED Notes (Signed)
Sent add on label to main lab to add on troponin

## 2016-10-31 NOTE — ED Notes (Signed)
Cancel venuos blood gas

## 2016-10-31 NOTE — ED Notes (Signed)
Patient transported to Ultrasound 

## 2016-10-31 NOTE — Consult Note (Signed)
CARDIOLOGY CONSULT NOTE   Referring Physician: Dr. Leonette Monarch Primary Physician: Dr. Deforest Hoyles Primary Cardiologist: N/A Reason for Consultation: bradycardia   HPI: Mr. Frank Owens is a 83 yo man with a PMH of HTN, dyslipidemia, leukemia, and anxiety/unspecified nervous system disorder who presented by EMS after being found unresponsive at home. Cardiology is consulted at the request of Dr. Leonette Monarch regarding bradycardia.  Patient is a difficult historian; updated history provided by family at bedside. Per family, he lives alone but requires significant care. Family has visited at least 2-3 times/day for feedings, medications, and assistance with ADLs. Earlier, the patient was in his usual state of health for breakfast. Family returned for dinner/medications, and he was sleeping comfortably in a recliner. An hour later, family called the patient's cell phone to check on him and he did not answer. They went to house and found him unresponsive. EMS was called. On arrival, his HR was noted to be in the 30s. No clear blood pressure recorded. Patient was started on trancutaneous pacing and became more alert. He was then transported to the ER.  Per the patient's family, he suffered some time of event (they called "breakdown") and his baseline has been the speech pattern he is currently using, consisting of somewhat mumbled speech. They also report he has had leukemia for several years, not currently on medications, being followed by Dr. Deforest Hoyles. His PMH is HTN, dyslipidemia. They deny any prior heart procedures/surgeries. They feel he has been deteriorating recently and now have difficulty maintaining his care in his home. They had plans to discuss future placement options in the coming days.  Patient and family deny chest pain, SOB, other episodes of LOC. No recent changes to medications. Patient doesn't remember event of earlier. Unclear if full loss of consciousness per family. No other complaints on ROS  except as noted  Review of Systems:     Cardiac Review of Systems: {Y] = yes [ ]  = no  Chest Pain [  N  ]  Resting SOB [ N  ] Exertional SOB  [ N ]  Orthopnea [ N ]   Pedal Edema [ N  ]    Palpitations Aqua.Slicker  ] Syncope  [?  ]   Presyncope [N   ]  General Review of Systems: [Y] = yes [  ]=no Constitional: recent weight change [  ]; anorexia [  ]; fatigue [  ]; nausea [  ]; night sweats [  ]; fever [  ]; or chills [  ];                                                                     Eyes : blurred vision [  ]; diplopia [   ]; vision changes [  ];  Amaurosis fugax[  ]; Resp: cough [  ];  wheezing[  ];  hemoptysis[  ];  PND [  ];  GI:  gallstones[  ], vomiting[  ];  dysphagia[  ]; melena[  ];  hematochezia [  ]; heartburn[  ];   GU: kidney stones [  ]; hematuria[  ];   dysuria [  ];  nocturia[  ]; incontinence [  ];  Skin: rash, swelling[  ];, hair loss[  ];  peripheral edema[  ];  or itching[  ]; Musculosketetal: myalgias[  ];  joint swelling[  ];  joint erythema[  ];  joint pain[  ];  back pain[  ];  Heme/Lymph: bruising[  ];  bleeding[  ];  anemia[  ];  Neuro: TIA[  ];  headaches[  ];  stroke[  ];  vertigo[  ];  seizures[  ];   paresthesias[  ];  difficulty walking[ Y ];  Psych:depression[  ]; anxiety[  ];  Endocrine: diabetes[  ];  thyroid dysfunction[  ];  Other:  Past Medical History:  Diagnosis Date  . Diabetes mellitus without complication ([place])   . Hypertension     (Not in a hospital admission)  Meds per family: Carvedilol 25 mg twice daily Atorvastatin 10 mg nightly Diltiazem ER 240 mg daily Diazepam PRN   No Known Allergies  Social History   Social History  . Marital status: Single    Spouse name: N/A  . Number of children: N/A  . Years of education: N/A   Occupational History  . Not on file.   Social History Main Topics  . Smoking status: Never Smoker  . Smokeless tobacco: Never Used  . Alcohol use No  . Drug use: No  . Sexual activity: No    Other Topics Concern  . Not on file   Social History Narrative  . No narrative on file    History reviewed. No pertinent family history.  PHYSICAL EXAM: Vitals:   10/31/16 2015 10/31/16 2100  BP: (!) 109/50 (!) 125/56  Pulse: (!) 115 (!) 57  Resp: 19 19  SpO2: 99% 96%    Intake/Output Summary (Last 24 hours) at 10/31/16 2129 Last data filed at 10/31/16 2015  Gross per 24 hour  Intake              100 ml  Output                0 ml  Net              100 ml   General:  Frail appearing. Initial exam while being externally paced, in no distress. Once pacer off, appears unchanged. HEENT: normal Neck: supple. no JVD. Carotids 2+ bilat; no bruits. No lymphadenopathy or thryomegaly appreciated. Cor: PMI nondisplaced. Intrinsic beat followed by paced beat. No rubs, gallops or murmurs. Lungs: clear Abdomen: soft, nontender, nondistended. No hepatosplenomegaly. No bruits or masses. Good bowel sounds. Extremities: no cyanosis, clubbing, rash, trace edema Neuro: alert, responsive, mumbles answers to question. Redirectable and will give pertinent answer with time. Resting in bed, doesn't move extremities beyond slight amount spontaneously but able to resist pressure for strength testing in upper and lower extremities bilaterally .  ECG: initial was junctional rhythm at 37 bpm  Results for orders placed or performed during the hospital encounter of 10/31/16 (from the past 24 hour(s))  CBC with Differential     Status: Abnormal   Collection Time: 10/31/16  7:25 PM  Result Value Ref Range   WBC 171.1 (HH) 4.0 - 10.5 K/uL   RBC 3.62 (L) 4.22 - 5.81 MIL/uL   Hemoglobin 11.3 (L) 13.0 - 17.0 g/dL   HCT 36.4 (L) 39.0 - 52.0 %   MCV 100.6 (H) 78.0 - 100.0 fL   MCH 31.2 26.0 - 34.0 pg   MCHC 31.0 30.0 - 36.0 g/dL   RDW 14.5 11.5 - 15.5 %   Platelets  239 150 - 400 K/uL   Neutrophils Relative % 5 %   Lymphocytes Relative 93 %   Monocytes Relative 2 %   Eosinophils Relative 0 %    Basophils Relative 0 %   Neutro Abs 8.6 (H) 1.7 - 7.7 K/uL   Lymphs Abs 159.1 (H) 0.7 - 4.0 K/uL   Monocytes Absolute 3.4 (H) 0.1 - 1.0 K/uL   Eosinophils Absolute 0.0 0.0 - 0.7 K/uL   Basophils Absolute 0.0 0.0 - 0.1 K/uL   WBC Morphology ABSOLUTE LYMPHOCYTOSIS   I-Stat Troponin, ED (not at [facility])     Status: None   Collection Time: 10/31/16  7:34 PM  Result Value Ref Range   Troponin i, poc 0.04 0.00 - 0.08 ng/mL   Comment 3          I-Stat Chem 8, ED     Status: Abnormal   Collection Time: 10/31/16  7:35 PM  Result Value Ref Range   Sodium 129 (L) 135 - 145 mmol/L   Potassium 7.5 (HH) 3.5 - 5.1 mmol/L   Chloride 98 (L) 101 - 111 mmol/L   BUN 40 (H) 6 - 20 mg/dL   Creatinine, Ser 2.20 (H) 0.61 - 1.24 mg/dL   Glucose, Bld 189 (H) 65 - 99 mg/dL   Calcium, Ion 1.01 (L) 1.15 - 1.40 mmol/L   TCO2 23 22 - 32 mmol/L   Hemoglobin 11.9 (L) 13.0 - 17.0 g/dL   HCT 35.0 (L) 39.0 - 52.0 %   Comment NOTIFIED PHYSICIAN   I-Stat venous blood gas, ED     Status: Abnormal   Collection Time: 10/31/16  7:45 PM  Result Value Ref Range   pH, Ven 7.386 7.250 - 7.430   pCO2, Ven 39.0 (L) 44.0 - 60.0 mmHg   pO2, Ven 39.0 32.0 - 45.0 mmHg   Bicarbonate 23.4 20.0 - 28.0 mmol/L   TCO2 25 22 - 32 mmol/L   O2 Saturation 73.0 %   Acid-base deficit 1.0 0.0 - 2.0 mmol/L   Patient temperature HIDE    Sample type VENOUS    Comment NOTIFIED PHYSICIAN    Dg Chest Port 1 View  Result Date: 10/31/2016 CLINICAL DATA:  Acute mental status change. EXAM: PORTABLE CHEST 1 VIEW COMPARISON:  None. FINDINGS: No pneumothorax. The lateral left lung base is obscured by a transcutaneous pacer. The heart is borderline. The hila are symmetric. A torturous thoracic aorta is identified. No mediastinal abnormalities are noted. No nodules, masses, or focal infiltrates. IMPRESSION: No active disease. Electronically Signed   By: Dorise Bullion III M.D   On: 10/31/2016 19:47   ASSESSMENT/Recommendations: Mr. Frank Owens is a 83 yo  man with a PMH of HTN, dyslipidemia, leukemia, and anxiety/unspecified nervous system disorder who presented by EMS after being found unresponsive at home. Cardiology is consulted at the request of Dr. Leonette Monarch regarding bradycardia.  It is unclear what the inciting event/trigger was at home that caused him to be somewhat unresponsive. No clear indication whether he was hypotensive with bradycardia--per verbal report he improved his mental status with pacing, but situation somewhat unclear.  When I turned external pacing off, he maintained a heart rate in the upper 50s to low 60s, and his blood pressure stayed at 120s/60s.   On extensive discussion with family, he is having difficulty living at home, and he requires assistance with many ADLs. He also has leukemia, which explains his very elevated white count on his labs. His full labs are not back at this  time, but his Istat potassium was 7.5, and his creatinine was elevated to 2.2. His potassium may be affecting his rhythm, and he is being treated for this in the ER.  Talking to family, given his overall functionality and comorbidities, they do not think they would Frank Owens procedures, including a permanent pacemaker, if possible. The patient couldn't clearly tell me his own wishes.  Given that he is currently stable and does not require pacing, recommendations would be: -to hold carvedilol and diltiazem given his bradycardia -treat his elevated potassium -monitor on telemetry -admit to hospitalist service for monitoring and coordination of care, including likely PT consult and discussion with Dr. Deforest Hoyles re: his leukemia.  If he becomes bradycardic and unstable overnight, please contact us and we would be happy to assist.  Buford Dresser, MD, PhD, overnight cardiology provider

## 2016-10-31 NOTE — ED Notes (Signed)
Pt remains on pacer

## 2016-11-01 LAB — CBC
HCT: 35.2 % — ABNORMAL LOW (ref 39.0–52.0)
Hemoglobin: 11.4 g/dL — ABNORMAL LOW (ref 13.0–17.0)
MCH: 31.6 pg (ref 26.0–34.0)
MCHC: 32.4 g/dL (ref 30.0–36.0)
MCV: 97.5 fL (ref 78.0–100.0)
PLATELETS: 185 10*3/uL (ref 150–400)
RBC: 3.61 MIL/uL — AB (ref 4.22–5.81)
RDW: 14 % (ref 11.5–15.5)
WBC: 128.9 10*3/uL (ref 4.0–10.5)

## 2016-11-01 LAB — URINALYSIS, ROUTINE W REFLEX MICROSCOPIC
Bilirubin Urine: NEGATIVE
GLUCOSE, UA: 150 mg/dL — AB
Ketones, ur: NEGATIVE mg/dL
NITRITE: NEGATIVE
PH: 5 (ref 5.0–8.0)
Protein, ur: NEGATIVE mg/dL
SPECIFIC GRAVITY, URINE: 1.01 (ref 1.005–1.030)

## 2016-11-01 LAB — BASIC METABOLIC PANEL
Anion gap: 8 (ref 5–15)
BUN: 31 mg/dL — AB (ref 6–20)
CHLORIDE: 99 mmol/L — AB (ref 101–111)
CO2: 23 mmol/L (ref 22–32)
Calcium: 8.1 mg/dL — ABNORMAL LOW (ref 8.9–10.3)
Creatinine, Ser: 1.68 mg/dL — ABNORMAL HIGH (ref 0.61–1.24)
GFR calc Af Amer: 38 mL/min — ABNORMAL LOW (ref >60)
GFR calc non Af Amer: 33 mL/min — ABNORMAL LOW (ref >60)
GLUCOSE: 114 mg/dL — AB (ref 65–99)
POTASSIUM: 5.3 mmol/L — AB (ref 3.5–5.1)
Sodium: 130 mmol/L — ABNORMAL LOW (ref 135–145)

## 2016-11-01 LAB — POTASSIUM
Potassium: 6 mmol/L — ABNORMAL HIGH (ref 3.5–5.1)
Potassium: 5.5 mmol/L — ABNORMAL HIGH (ref 3.5–5.1)
Potassium: 4.5 mmol/L (ref 3.5–5.1)

## 2016-11-01 LAB — MRSA PCR SCREENING: MRSA by PCR: POSITIVE — AB

## 2016-11-01 LAB — TSH: TSH: 4.842 u[IU]/mL — ABNORMAL HIGH (ref 0.350–4.500)

## 2016-11-01 LAB — CK: CK TOTAL: 320 U/L (ref 49–397)

## 2016-11-01 LAB — URIC ACID: URIC ACID, SERUM: 7.4 mg/dL (ref 4.4–7.6)

## 2016-11-01 LAB — LACTATE DEHYDROGENASE: LDH: 944 U/L — AB (ref 98–192)

## 2016-11-01 MED ORDER — MUPIROCIN 2 % EX OINT
1.0000 "application " | TOPICAL_OINTMENT | Freq: Two times a day (BID) | CUTANEOUS | Status: DC
  Administered 2016-11-01 – 2016-11-04 (×7): 1 via NASAL
  Filled 2016-11-01: qty 22

## 2016-11-01 MED ORDER — CHLORHEXIDINE GLUCONATE CLOTH 2 % EX PADS
6.0000 | MEDICATED_PAD | Freq: Every day | CUTANEOUS | Status: DC
Start: 2016-11-01 — End: 2016-11-04
  Administered 2016-11-01 – 2016-11-04 (×4): 6 via TOPICAL

## 2016-11-01 NOTE — Progress Notes (Signed)
   Progress Note  Patient Name: [name] Date of Encounter: 11/01/2016  Patient seen by Dr. Harrell Gave overnight, I reviewed the consultation and agree with recommendations AV nodal blockers held and heart rate in the 70s with sinus rhythm and prolonged PR interval by telemetry. Do not anticipate aggressive cardiac workup at this point.  Signed, Rozann Lesches, MD  11/01/2016, 11:51 AM

## 2016-11-01 NOTE — ED Notes (Signed)
Bladder scan showed 41ml  in bladder

## 2016-11-01 NOTE — Progress Notes (Signed)
PROGRESS NOTE  Frank Owens BJY:[id number] DOB: 04-28-1919 DOA: 10/31/2016 PCP: [facility]  HPI/Recap of past 47 hours: 83 year old male with past medical history significant for hypertension, DM, CLL," nervous breakdown", presents to the ED after a family member found him unresponsive in his home.  Patient lives alone, but requires significant care from the family.  They visit a couple of times a day for feedings, medications, assistance with ADLs.  EMS was called, on arrival heart rate in the 30s, no blood pressure noted.  Transcutaneous pacing was started and patient became more alert and was transported to the ER.  Patient was given a dose of atropine.  In the ED, hyperkalemia was noted with a potassium of 7.5 which subsequently improved to 5.7 after treatment.  Patient was also found to have AK I as well as leukocytosis (history of CLL)  Overnight patient was stabilized, transcutaneous pacing discontinued and patient had maintained heart rate in the 60s.  Patient is difficult to communicate with, as he goes off tangent, and speech mumbled.  Review of systems was not done, although patient looks comfortable and denied any pain.  Assessment/Plan: Principal Problem:   Bradycardia Active Problems:   Hyperkalemia   AKI (acute kidney injury) ([place])   CLL (chronic lymphocytic leukemia) ([facility])   HTN (hypertension)  #Bradycardia Improved, heart rate in the 60s without any transcutaneous pacing Unclear etiology Cardiology on board, continue to hold Coreg and diltiazem Given his overall functionality, family does not want any procedures including a permanent pacemaker if need be Monitor on telemetry-heart rate in the 70s with sinus rhythm and prolonged PR interval  #Hyperkalemia Resolved, unknown etiology Daily BMP Telemetry  #AK I Resolving, creatinine 1.68 from 2.2 Likely secondary to poor oral intake Continue IV fluids Ultrasound renal suggestive of chronic  renal disease Daily BMP  #Hypertension Controlled Keep holding Coreg and diltiazem due to bradycardia  #CLL WBC, currently around baseline LDH elevated due to CLL Unlikely to tumor lysis syndrome as AK I is improving and patient not on any therapy  Disposition plans PT/OT Social worker for possible placement as family unable to care for him     Code Status: Full  Family Communication: None  Disposition Plan: Likely SNF, social worker consult placed   Consultants: Cardiology  Procedures: Transcutaneous pacing  Antimicrobials:  None  DVT prophylaxis: Heparin   Objective: Vitals:   11/01/16 0430 11/01/16 0823 11/01/16 1438 11/01/16 1616  BP: 139/61 (!) 155/55 135/72 (!) 132/96  Pulse: (!) 58 61 74 74  Resp: 18  (!) 24 18  Temp: (!) 97.3 F (36.3 C) (!) 97.4 F (36.3 C)  (!) 97.5 F (36.4 C)  TempSrc: Oral Oral  Oral  SpO2: 98% 98% 95% 98%  Weight:      Height:        Intake/Output Summary (Last 24 hours) at 11/01/16 1635 Last data filed at 11/01/16 1430  Gross per 24 hour  Intake          2505.83 ml  Output             1050 ml  Net          1455.83 ml   Filed Weights   10/31/16 1915 11/01/16 0110  Weight: 79.4 kg (175 lb) 70.4 kg (155 lb 3.3 oz)    Exam:   General: Alert, awake, muffled speech, looks comfortable  Cardiovascular: Heart sounds S1-S2 present no added sounds  Respiratory: Chest clear bilaterally  Abdomen:  Soft, nondistended, nontender  Musculoskeletal: No pedal edema  Skin: Normal  Psychiatry: Talkative, pleasant   Data Reviewed: CBC:  Recent Labs Lab 10/31/16 1925 10/31/16 1935 11/01/16 0239  WBC 171.1*  --  128.9*  NEUTROABS 8.6*  --   --   HGB 11.3* 11.9* 11.4*  HCT 36.4* 35.0* 35.2*  MCV 100.6*  --  97.5  PLT 239  --  528   Basic Metabolic Panel:  Recent Labs Lab 10/31/16 1925 10/31/16 1935 10/31/16 2145 11/01/16 0239 11/01/16 1020 11/01/16 1320  NA 128* 129* 129* 130*  --   --   K NOT  CALCULATED 7.5* 5.7* 5.3*  6.0* 5.5* 4.5  CL 96* 98* 99* 99*  --   --   CO2 21*  --  22 23  --   --   GLUCOSE 184* 189* 166* 114*  --   --   BUN 34* 40* 36* 31*  --   --   CREATININE 2.15* 2.20* 1.96* 1.68*  --   --   CALCIUM 8.0*  --  8.2* 8.1*  --   --    GFR: Estimated Creatinine Clearance: 25.4 mL/min (A) (by C-G formula based on SCr of 1.68 mg/dL (H)). Liver Function Tests:  Recent Labs Lab 10/31/16 1925  AST 36  ALT 14*  ALKPHOS 82  BILITOT 1.2  PROT 5.5*  ALBUMIN 3.1*   No results for input(s): LIPASE, AMYLASE in the last 168 hours. No results for input(s): AMMONIA in the last 168 hours. Coagulation Profile: No results for input(s): INR, PROTIME in the last 168 hours. Cardiac Enzymes:  Recent Labs Lab 10/31/16 1925 10/31/16 2310  CKTOTAL  --  320  TROPONINI 0.04*  --    BNP (last 3 results) No results for input(s): PROBNP in the last 8760 hours. HbA1C: No results for input(s): HGBA1C in the last 72 hours. CBG: No results for input(s): GLUCAP in the last 168 hours. Lipid Profile: No results for input(s): CHOL, HDL, LDLCALC, TRIG, CHOLHDL, LDLDIRECT in the last 72 hours. Thyroid Function Tests:  Recent Labs  10/31/16 2313  TSH 4.842*   Anemia Panel: No results for input(s): VITAMINB12, FOLATE, FERRITIN, TIBC, IRON, RETICCTPCT in the last 72 hours. Urine analysis:    Component Value Date/Time   COLORURINE YELLOW 10/31/2016 0427   APPEARANCEUR HAZY (A) 10/31/2016 0427   LABSPEC 1.010 10/31/2016 0427   PHURINE 5.0 10/31/2016 0427   GLUCOSEU 150 (A) 10/31/2016 0427   HGBUR SMALL (A) 10/31/2016 0427   BILIRUBINUR NEGATIVE 10/31/2016 0427   KETONESUR NEGATIVE 10/31/2016 0427   PROTEINUR NEGATIVE 10/31/2016 0427   NITRITE NEGATIVE 10/31/2016 0427   LEUKOCYTESUR MODERATE (A) 10/31/2016 0427   Sepsis Labs: @LABRCNTIP (procalcitonin:4,lacticidven:4)  ) Recent Results (from the past 240 hour(s))  MRSA PCR Screening     Status: Abnormal   Collection  Time: 11/01/16  3:36 AM  Result Value Ref Range Status   MRSA by PCR POSITIVE (A) NEGATIVE Final    Comment:        The GeneXpert MRSA Assay (FDA approved for NASAL specimens only), is one component of a comprehensive MRSA colonization surveillance program. It is not intended to diagnose MRSA infection nor to guide or monitor treatment for MRSA infections. RESULT CALLED TO, READ BACK BY AND VERIFIED WITH: IRBY,T RN [id number] AT 0629 SKEEN,P       Studies: US Renal  Result Date: 11/01/2016 CLINICAL DATA:  Acute kidney injury. History of hypertension and diabetes. EXAM: RENAL / URINARY TRACT ULTRASOUND  COMPLETE COMPARISON:  None. FINDINGS: Right Kidney: Length: 11.2 cm. Diffuse parenchymal thinning consistent with chronic renal disease. Cyst in the lower pole the right kidney measuring 1.2 cm maximal diameter. Shadowing stone in the right lower pole measuring 6 mm diameter. No hydronephrosis. Left Kidney: Length: 11.3 cm. Diffuse parenchymal thinning consistent with chronic renal disease. No focal lesions are identified. No hydronephrosis. Bladder: The bladder wall is diffusely thickened and somewhat irregular. This may be due to cystitis or hypertrophy from outlet obstruction. No intraluminal filling defects. The prostate gland is enlarged, measuring 4.3 cm maximal dimension and causing impression on the bladder base. IMPRESSION: 1. Bilateral renal parenchymal thinning consistent with chronic renal disease. No hydronephrosis. 2. Small cyst and stone demonstrated in the lower pole right kidney. 3. Diffusely thickened and irregular bladder wall may be due to cystitis or hypertrophy. 4. Enlarged prostate gland. Electronically Signed   By: Lucienne Capers M.D.   On: 11/01/2016 00:14   Dg Chest Port 1 View  Result Date: 10/31/2016 CLINICAL DATA:  Acute mental status change. EXAM: PORTABLE CHEST 1 VIEW COMPARISON:  None. FINDINGS: No pneumothorax. The lateral left lung base is obscured by a  transcutaneous pacer. The heart is borderline. The hila are symmetric. A torturous thoracic aorta is identified. No mediastinal abnormalities are noted. No nodules, masses, or focal infiltrates. IMPRESSION: No active disease. Electronically Signed   By: Dorise Bullion III M.D   On: 10/31/2016 19:47    Scheduled Meds: . Chlorhexidine Gluconate Cloth  6 each Topical Q0600  . heparin  5,000 Units Subcutaneous Q8H  . mupirocin ointment  1 application Nasal BID    Continuous Infusions: . sodium chloride 125 mL/hr at 11/01/16 1217     LOS: 1 day     Alma Friendly, MD [facility] Pager [phone]  If 7PM-7AM, please contact night-coverage [email or web address] Password [facility] 11/01/2016, 4:35 PM

## 2016-11-01 NOTE — Evaluation (Signed)
Occupational Therapy Evaluation Patient Details Name: Frank Owens MRN: [id number] DOB: [birth year] Today's Date: 11/01/2016    History of Present Illness Frank Owens is a 83 yo man with a PMH of HTN, dyslipidemia, leukemia, and anxiety/unspecified nervous system disorder who presented by EMS after being found unresponsive.   Clinical Impression   PTA Pt IADL completed by family, Pt reports set up for ADL and ambulation with quad cane. Pt [place] currently min A for ADL and min A for mobility with RW. Please see OT problem list below. Pt will require skilled OT in the acute setting and afterwards at the SNF level to maximize safety and independence in ADL and functional transfers. Next session to focus on seated balance for grooming tasks and LB dressing.     Follow Up Recommendations  SNF    Equipment Recommendations  Other (comment) (defer to next venue)    Recommendations for Other Services       Precautions / Restrictions Precautions Precautions: Fall Restrictions Weight Bearing Restrictions: No      Mobility Bed Mobility Overal bed mobility: Needs Assistance Bed Mobility: Supine to Sit     Supine to sit: Min assist;HOB elevated     General bed mobility comments: Pt required increased time, able to elevate trunk min guard, Pt required assist to bring hips to EOB (bed pad used)  Transfers Overall transfer level: Needs assistance Equipment used: Rolling walker (2 wheeled) (Pt uses quad cane at baseline) Transfers: Sit to/from Stand Sit to Stand: Min assist;From elevated surface         General transfer comment: min A for balance and steady    Balance Overall balance assessment: Needs assistance Sitting-balance support: No upper extremity supported;Feet supported Sitting balance-Leahy Scale: Fair Sitting balance - Comments: sitting EOB with no back support   Standing balance support: Bilateral upper extremity supported;During functional activity Standing  balance-Leahy Scale: Poor Standing balance comment: requires external support                           ADL either performed or assessed with clinical judgement   ADL Overall ADL's : Needs assistance/impaired Eating/Feeding: Set up;Sitting Eating/Feeding Details (indicate cue type and reason): required assist to open containers, problem solve to drink milk with straw, food was cut up for him able to perform hand to mouth and manipulate utensils Grooming: Wash/dry hands;Wash/dry face;Set up;Sitting   Upper Body Bathing: Set up   Lower Body Bathing: Minimal assistance   Upper Body Dressing : Min guard   Lower Body Dressing: Min guard  Pt able to doff/don socks sitting in recliner Toilet Transfer: Ambulation;Cueing for sequencing;Cueing for safety;Minimal assistance;RW Toilet Transfer Details (indicate cue type and reason): simulated through room ambulation and recliner transfer Toileting- Clothing Manipulation and Hygiene: Minimal assistance       Functional mobility during ADLs: Minimal assistance;Rolling walker;Cueing for safety;Cueing for sequencing       Vision         Perception     Praxis      Pertinent Vitals/Pain Pain Assessment: No/denies pain     Hand Dominance Right   Extremity/Trunk Assessment Upper Extremity Assessment Upper Extremity Assessment: Generalized weakness   Lower Extremity Assessment Lower Extremity Assessment: Defer to PT evaluation   Cervical / Trunk Assessment Cervical / Trunk Assessment: Kyphotic   Communication Communication Communication: HOH   Cognition Arousal/Alertness: Awake/alert Behavior During Therapy: WFL for tasks assessed/performed Overall Cognitive Status: No family/caregiver present to  determine baseline cognitive functioning Area of Impairment: Following commands;Safety/judgement;Awareness;Problem solving                       Following Commands: Follows one step commands with increased  time Safety/Judgement: Decreased awareness of safety;Decreased awareness of deficits Awareness: Emergent Problem Solving: Slow processing;Difficulty sequencing;Requires verbal cues;Requires tactile cues General Comments: Pt telling unrelated stories   General Comments       Exercises     Shoulder Instructions      Home Living Family/patient expects to be discharged to:: Private residence Living Arrangements: Alone (Children check on him 2-3 times daily) Available Help at Discharge: Family;Available PRN/intermittently Type of Home: House Home Access: Stairs to enter [employer] of Steps: 2   Home Layout: One level     Bathroom Shower/Tub: Teacher, early years/pre: Standard     Home Equipment: Cane - single point;Cane - quad          Prior Functioning/Environment Level of Independence: Needs assistance  Gait / Transfers Assistance Needed: Pt reports that he uses a quad cane ADL's / Homemaking Assistance Needed: Children perform all IADL (cooking, laundry etc), Pt reports that until recently he was bathing himself (gets all the way down in tub), dressing, and feeding himself            OT Problem List: Decreased activity tolerance;Impaired balance (sitting and/or standing);Decreased safety awareness;Decreased knowledge of use of DME or AE      OT Treatment/Interventions: Self-care/ADL training;DME and/or AE instruction;Therapeutic activities;Patient/family education;Balance training    OT Goals(Current goals can be found in the care plan section) Acute Rehab OT Goals Patient Stated Goal: to get stronger so I don't fall OT Goal Formulation: With patient Time For Goal Achievement: 11/15/16 Potential to Achieve Goals: Good ADL Goals Pt Will Perform Grooming: with modified independence;sitting Pt Will Transfer to Toilet: with supervision;ambulating;regular height toilet (with least resrictive DME) Pt Will Perform Toileting - Clothing Manipulation  and hygiene: with modified independence;sit to/from stand Pt Will Perform Tub/Shower Transfer: Tub transfer;with caregiver independent in assisting;with min guard assist;shower seat  OT Frequency: Min 2X/week   Barriers to D/C:    Pt lives alone       Co-evaluation PT/OT/SLP Co-Evaluation/Treatment: Yes Reason for Co-Treatment: Necessary to address cognition/behavior during functional activity;For patient/therapist safety;To address functional/ADL transfers PT goals addressed during session: Mobility/safety with mobility OT goals addressed during session: ADL's and self-care      AM-PAC PT "6 Clicks" Daily Activity     Outcome Measure Help from another person eating meals?: A Little Help from another person taking care of personal grooming?: A Little Help from another person toileting, which includes using toliet, bedpan, or urinal?: A Little Help from another person bathing (including washing, rinsing, drying)?: A Little Help from another person to put on and taking off regular upper body clothing?: None Help from another person to put on and taking off regular lower body clothing?: A Little 6 Click Score: 19   End of Session Equipment Utilized During Treatment: Gait belt;Rolling walker Nurse Communication: Mobility status  Activity Tolerance: Patient tolerated treatment well Patient left: in chair;with call bell/phone within reach;with chair alarm set  OT Visit Diagnosis: Unsteadiness on feet (R26.81);History of falling (Z91.81)                Time: 6761-9509 OT Time Calculation (min): 24 min Charges:  OT General Charges $OT Visit: 1 Visit OT Evaluation $OT Eval Moderate Complexity: 1 Mod  G-Codes:     Hulda Humphrey OTR/L [place] 11/01/2016, 9:42 AM

## 2016-11-01 NOTE — Evaluation (Signed)
Physical Therapy Evaluation Patient Details Name: Frank Owens MRN: [id number] DOB: [birth year] Today's Date: 11/01/2016   History of Present Illness  Frank Owens is a 83 yo man with a PMH of HTN, dyslipidemia, leukemia, and anxiety/unspecified nervous system disorder who presented by EMS after being found unresponsive.  Clinical Impression  Patient presents with decreased independence and safety with mobility due to deficits listed in PT problem list.  Currently needs min A for balance with all aspects of mobility and would be high fall risk esp if home unsupervised and attempting in tub without assist.  Feel continued skilled PT in the acute setting and follow up SNF level rehab indicated prior to d/c home.     Follow Up Recommendations SNF;Supervision/Assistance - 24 hour    Equipment Recommendations  Rolling walker with 5" wheels    Recommendations for Other Services       Precautions / Restrictions Precautions Precautions: Fall Restrictions Weight Bearing Restrictions: No      Mobility  Bed Mobility Overal bed mobility: Needs Assistance Bed Mobility: Supine to Sit     Supine to sit: Min assist;HOB elevated     General bed mobility comments: increased time to EOB assist for lines and to scoot hips  Transfers Overall transfer level: Needs assistance Equipment used: Rolling walker (2 wheeled) Transfers: Sit to/from Stand Sit to Stand: Min assist;From elevated surface;+2 safety/equipment         General transfer comment: min A for balance and steady +2 for lines, redirection needed as pt perseverating on need for pants and shoes  Ambulation/Gait Ambulation/Gait assistance: Min assist;+2 safety/equipment Ambulation Distance (Feet): 80 Feet Assistive device: Rolling walker (2 wheeled) Gait Pattern/deviations: Step-through pattern;Step-to pattern;Trunk flexed;Wide base of support;Decreased stride length;Shuffle     General Gait Details: min A needed and cues esp  with turns due to increased proximity to walker with decreased safety and increased fall risk; twice SpO2 reading in 70's (but with decreased correlation on monitor,) back up to 90's with standing rest and cues for PLB (but not coordinated with this)  Stairs            Wheelchair Mobility    Modified Rankin (Stroke Patients Only)       Balance Overall balance assessment: Needs assistance Sitting-balance support: No upper extremity supported;Feet supported Sitting balance-Leahy Scale: Fair Sitting balance - Comments: sitting EOB with no back support   Standing balance support: No upper extremity supported Standing balance-Leahy Scale: Poor Standing balance comment: needed UE support for balance; tried some mobility without walker, but unsteady and reaching for things needing HHA bilaterally                             Pertinent Vitals/Pain Pain Assessment: No/denies pain    Home Living Family/patient expects to be discharged to:: Private residence Living Arrangements: Alone (nephew checks on him 2-3 times a day) Available Help at Discharge: Family;Available PRN/intermittently Type of Home: House Home Access: Stairs to enter   Entrance Stairs-Number of Steps: 2 Home Layout: One level Home Equipment: Cane - single point;Cane - quad      Prior Function Level of Independence: Needs assistance   Gait / Transfers Assistance Needed: Pt reports that he uses a quad cane  ADL's / Homemaking Assistance Needed: Family perform all IADL (cooking, laundry etc), Pt reports that until recently he was bathing himself (gets all the way down in tub), dressing, and feeding himself  Hand Dominance   Dominant Hand: Right    Extremity/Trunk Assessment   Upper Extremity Assessment Upper Extremity Assessment: Defer to OT evaluation    Lower Extremity Assessment Lower Extremity Assessment: Generalized weakness    Cervical / Trunk Assessment Cervical / Trunk  Assessment: Kyphotic  Communication   Communication: HOH  Cognition Arousal/Alertness: Awake/alert Behavior During Therapy: WFL for tasks assessed/performed Overall Cognitive Status: No family/caregiver present to determine baseline cognitive functioning Area of Impairment: Following commands;Safety/judgement;Awareness;Problem solving                       Following Commands: Follows one step commands with increased time;Follows one step commands consistently Safety/Judgement: Decreased awareness of deficits;Decreased awareness of safety Awareness: Emergent Problem Solving: Slow processing;Requires verbal cues General Comments: Pt telling unrelated stories      General Comments      Exercises     Assessment/Plan    PT Assessment Patient needs continued PT services  PT Problem List Decreased mobility;Decreased safety awareness;Decreased balance;Decreased activity tolerance;Decreased knowledge of use of DME;Decreased cognition;Decreased strength       PT Treatment Interventions DME instruction;Gait training;Functional mobility training;Balance training;Therapeutic exercise;Patient/family education;Therapeutic activities    PT Goals (Current goals can be found in the Care Plan section)  Acute Rehab PT Goals Patient Stated Goal: to get stronger so I don't fall PT Goal Formulation: With patient Time For Goal Achievement: 11/08/16 Potential to Achieve Goals: Good    Frequency Min 3X/week   Barriers to discharge Decreased caregiver support (only has help intermittently and they are also elderly)      Co-evaluation PT/OT/SLP Co-Evaluation/Treatment: Yes Reason for Co-Treatment: Necessary to address cognition/behavior during functional activity;For patient/therapist safety PT goals addressed during session: Mobility/safety with mobility;Balance;Proper use of DME OT goals addressed during session: ADL's and self-care       AM-PAC PT "6 Clicks" Daily Activity   Outcome Measure Difficulty turning over in bed (including adjusting bedclothes, sheets and blankets)?: A Little Difficulty moving from lying on back to sitting on the side of the bed? : A Little Difficulty sitting down on and standing up from a chair with arms (e.g., wheelchair, bedside commode, etc,.)?: Unable Help needed moving to and from a bed to chair (including a wheelchair)?: A Little Help needed walking in hospital room?: A Little Help needed climbing 3-5 steps with a railing? : A Little 6 Click Score: 16    End of Session Equipment Utilized During Treatment: Gait belt;Oxygen (ambulated on RA, but replaced on O2 at rest due to question desats with mobility) Activity Tolerance: Patient tolerated treatment well Patient left: in chair;with call bell/phone within reach;with chair alarm set Nurse Communication: Mobility status PT Visit Diagnosis: Unsteadiness on feet (R26.81);Muscle weakness (generalized) (M62.81);Other abnormalities of gait and mobility (R26.89)    Time: 1287-8676 PT Time Calculation (min) (ACUTE ONLY): 24 min   Charges:   PT Evaluation $PT Eval Moderate Complexity: 1 Mod     PT G CodesMagda Kiel, [place] [phone] 11/01/2016   Reginia Naas 11/01/2016, 10:13 AM

## 2016-11-02 ENCOUNTER — Inpatient Hospital Stay (HOSPITAL_COMMUNITY): Payer: Medicare Other

## 2016-11-02 LAB — BASIC METABOLIC PANEL
ANION GAP: 6 (ref 5–15)
Anion gap: 7 (ref 5–15)
BUN: 20 mg/dL (ref 6–20)
BUN: 22 mg/dL — ABNORMAL HIGH (ref 6–20)
CALCIUM: 7.7 mg/dL — AB (ref 8.9–10.3)
CHLORIDE: 109 mmol/L (ref 101–111)
CO2: 22 mmol/L (ref 22–32)
CO2: 19 mmol/L — ABNORMAL LOW (ref 22–32)
CREATININE: 1.22 mg/dL (ref 0.61–1.24)
Calcium: 7.8 mg/dL — ABNORMAL LOW (ref 8.9–10.3)
Chloride: 108 mmol/L (ref 101–111)
Creatinine, Ser: 1.09 mg/dL (ref 0.61–1.24)
GFR, EST AFRICAN AMERICAN: 56 mL/min — AB (ref >60)
GFR, EST NON AFRICAN AMERICAN: 56 mL/min — AB (ref >60)
GFR, EST NON AFRICAN AMERICAN: 49 mL/min — AB (ref >60)
Glucose, Bld: 112 mg/dL — ABNORMAL HIGH (ref 65–99)
Glucose, Bld: 120 mg/dL — ABNORMAL HIGH (ref 65–99)
POTASSIUM: 5.7 mmol/L — AB (ref 3.5–5.1)
Potassium: 5.4 mmol/L — ABNORMAL HIGH (ref 3.5–5.1)
SODIUM: 136 mmol/L (ref 135–145)
SODIUM: 135 mmol/L (ref 135–145)

## 2016-11-02 LAB — CBC WITH DIFFERENTIAL/PLATELET
BASOS ABS: 0 10*3/uL (ref 0.0–0.1)
Basophils Relative: 0 %
EOS ABS: 0 10*3/uL (ref 0.0–0.7)
Eosinophils Relative: 0 %
HCT: 34.4 % — ABNORMAL LOW (ref 39.0–52.0)
HEMOGLOBIN: 11 g/dL — AB (ref 13.0–17.0)
LYMPHS PCT: 92 %
Lymphs Abs: 94.5 10*3/uL — ABNORMAL HIGH (ref 0.7–4.0)
MCH: 31.2 pg (ref 26.0–34.0)
MCHC: 32 g/dL (ref 30.0–36.0)
MCV: 97.5 fL (ref 78.0–100.0)
MONO ABS: 2.1 10*3/uL — AB (ref 0.1–1.0)
Monocytes Relative: 2 %
NEUTROS PCT: 6 %
Neutro Abs: 6.2 10*3/uL (ref 1.7–7.7)
PLATELETS: 161 10*3/uL (ref 150–400)
RBC: 3.53 MIL/uL — ABNORMAL LOW (ref 4.22–5.81)
RDW: 14.7 % (ref 11.5–15.5)
WBC: 102.8 10*3/uL (ref 4.0–10.5)

## 2016-11-02 LAB — MAGNESIUM: MAGNESIUM: 2.3 mg/dL (ref 1.7–2.4)

## 2016-11-02 MED ORDER — HYDRALAZINE HCL 25 MG PO TABS
25.0000 mg | ORAL_TABLET | Freq: Three times a day (TID) | ORAL | Status: DC
  Administered 2016-11-02 – 2016-11-04 (×6): 25 mg via ORAL
  Filled 2016-11-02 (×7): qty 1

## 2016-11-02 MED ORDER — CARVEDILOL 6.25 MG PO TABS
6.2500 mg | ORAL_TABLET | Freq: Once | ORAL | Status: AC
  Administered 2016-11-02: 6.25 mg via ORAL
  Filled 2016-11-02: qty 1

## 2016-11-02 MED ORDER — SODIUM CHLORIDE 0.9 % IV BOLUS (SEPSIS)
1000.0000 mL | Freq: Once | INTRAVENOUS | Status: AC
  Administered 2016-11-02: 1000 mL via INTRAVENOUS

## 2016-11-02 MED ORDER — IOPAMIDOL (ISOVUE-370) INJECTION 76%
INTRAVENOUS | Status: AC
  Administered 2016-11-02: 100 mL
  Filled 2016-11-02: qty 100

## 2016-11-02 MED ORDER — HYDRALAZINE HCL 20 MG/ML IJ SOLN
10.0000 mg | Freq: Once | INTRAMUSCULAR | Status: AC
  Administered 2016-11-02: 10 mg via INTRAVENOUS
  Filled 2016-11-02: qty 1

## 2016-11-02 MED ORDER — HYDRALAZINE HCL 20 MG/ML IJ SOLN
10.0000 mg | Freq: Four times a day (QID) | INTRAMUSCULAR | Status: DC | PRN
  Administered 2016-11-04: 10 mg via INTRAVENOUS
  Filled 2016-11-02: qty 1

## 2016-11-02 MED ORDER — METOPROLOL TARTRATE 5 MG/5ML IV SOLN
2.5000 mg | Freq: Once | INTRAVENOUS | Status: AC
  Administered 2016-11-02: 2.5 mg via INTRAVENOUS
  Filled 2016-11-02: qty 5

## 2016-11-02 NOTE — Progress Notes (Signed)
Patient with a rhythm/rate change on telemetry. Patient had previously been 1st degree HB with rate 70's. Converted to ventricular bigeminy with rate 110's. Dr. Horris Latino notified. New orders received.  Joellen Jersey, RN

## 2016-11-02 NOTE — Plan of Care (Signed)
Problem: Safety: Goal: Ability to remain free from injury will improve Outcome: Progressing Bed in low position. Call bell within reach.

## 2016-11-02 NOTE — Progress Notes (Signed)
Short visit with patient and friend Vickii Chafe.  Had prayer with patient and friend. Conard Novak, Chaplain    11/02/16 1300  Clinical Encounter Type  Visited With Patient;Other (Comment)  Visit Type Initial  Spiritual Encounters  Spiritual Needs Prayer  Stress Factors  Patient Stress Factors None identified  Family Stress Factors None identified

## 2016-11-02 NOTE — Progress Notes (Signed)
PROGRESS NOTE  Frank Owens PPI:[id number] DOB: 14-Jun-1919 DOA: 10/31/2016 PCP: [facility]  HPI/Recap of past 44 hours: 83 year old male with past medical history significant for hypertension, diabetes, CLL, is currently managed for bradycardia after family found him unresponsive in his home as well as hyperkalemia of potassium 7.5 and AK I.  Today, heart rate noted to be in the 90s -110, asymptomatic. The patient is difficult to communicate with, as he goes off tangents and speech mumbled.  Review of systems was not done, although patient looks comfortable.  Assessment/Plan: Principal Problem:   Bradycardia Active Problems:   Hyperkalemia   AKI (acute kidney injury) ([place])   CLL (chronic lymphocytic leukemia) (H[facility])   HTN (hypertension)  #Bradycardia Resolved, currently more tachycardic ?? Tachy-brady syndrome Vs med induced (pt on 25mg  BID of coreg, diltiazem at home) Cardiology on board, continue to hold Coreg and diltiazem Given his overall functionality, family does not want any procedures including the permanent pacemaker if need be Due to the current ventricular bigeminy with heart rates in the 110s, will give a dose of Coreg 6.25 mg once EKG Monitor on telemetry  #Hyperkalemia Fluctuating, unknown etiology Daily BMP Telemetry  #AK I Resolved Likely secondary to poor oral intake Continue maintenance IV fluids Ultrasound renal suggestive of chronic renal disease  #Hypertension Uncontrolled Continue PO hydralazine, IV as needed Keep holding Coreg and diltiazem, may reconsider restarting low-dose Coreg  #CLL Stable  #Disposition plans PT/OT Social worker for possible placement as family unable to care for him     Code Status: Full  Family Communication: None  Disposition Plan: Likely SNF    Consultants:  Cardiology  Procedures:  Transcutaneous pacing   Antimicrobials:  None  DVT prophylaxis:   Heparin   Objective: Vitals:   11/02/16 0400 11/02/16 0549 11/02/16 0800 11/02/16 1200  BP: (!) 170/71 (!) 160/88 (!) 175/67 (!) 143/74  Pulse: 85 89 76 (!) 110  Resp: 17 18 15  (!) 21  Temp:   98.1 F (36.7 C) 97.6 F (36.4 C)  TempSrc:   Oral Oral  SpO2:  97% 98% 97%  Weight:      Height:        Intake/Output Summary (Last 24 hours) at 11/02/16 1405 Last data filed at 11/02/16 1300  Gross per 24 hour  Intake          1994.17 ml  Output             4840 ml  Net         -2845.83 ml   Filed Weights   10/31/16 1915 11/01/16 0110  Weight: 79.4 kg (175 lb) 70.4 kg (155 lb 3.3 oz)    Exam:   General:  Alert, awake, multiple speech, looks comfortable  Cardiovascular: Heart sounds S1-S2 present no added sounds  Respiratory: Chest clear bilaterally  Abdomen: Soft nondistended nontender  Musculoskeletal: No pedal edema  Skin: Normal  Psychiatry: Talkative, pleasant   Data Reviewed: CBC:  Recent Labs Lab 10/31/16 1925 10/31/16 1935 11/01/16 0239 11/02/16 0243  WBC 171.1*  --  128.9* 102.8*  NEUTROABS 8.6*  --   --  6.2  HGB 11.3* 11.9* 11.4* 11.0*  HCT 36.4* 35.0* 35.2* 34.4*  MCV 100.6*  --  97.5 97.5  PLT 239  --  185 063   Basic Metabolic Panel:  Recent Labs Lab 10/31/16 1925 10/31/16 1935 10/31/16 2145 11/01/16 0239 11/01/16 1020 11/01/16 1320 11/02/16 0243  NA 128* 129* 129* 130*  --   --  136  K NOT CALCULATED 7.5* 5.7* 5.3*  6.0* 5.5* 4.5 5.4*  CL 96* 98* 99* 99*  --   --  108  CO2 21*  --  22 23  --   --  22  GLUCOSE 184* 189* 166* 114*  --   --  112*  BUN 34* 40* 36* 31*  --   --  20  CREATININE 2.15* 2.20* 1.96* 1.68*  --   --  1.09  CALCIUM 8.0*  --  8.2* 8.1*  --   --  7.7*   GFR: Estimated Creatinine Clearance: 39.2 mL/min (by C-G formula based on SCr of 1.09 mg/dL). Liver Function Tests:  Recent Labs Lab 10/31/16 1925  AST 36  ALT 14*  ALKPHOS 82  BILITOT 1.2  PROT 5.5*  ALBUMIN 3.1*   No results for input(s):  LIPASE, AMYLASE in the last 168 hours. No results for input(s): AMMONIA in the last 168 hours. Coagulation Profile: No results for input(s): INR, PROTIME in the last 168 hours. Cardiac Enzymes:  Recent Labs Lab 10/31/16 1925 10/31/16 2310  CKTOTAL  --  320  TROPONINI 0.04*  --    BNP (last 3 results) No results for input(s): PROBNP in the last 8760 hours. HbA1C: No results for input(s): HGBA1C in the last 72 hours. CBG: No results for input(s): GLUCAP in the last 168 hours. Lipid Profile: No results for input(s): CHOL, HDL, LDLCALC, TRIG, CHOLHDL, LDLDIRECT in the last 72 hours. Thyroid Function Tests:  Recent Labs  10/31/16 2313  TSH 4.842*   Anemia Panel: No results for input(s): VITAMINB12, FOLATE, FERRITIN, TIBC, IRON, RETICCTPCT in the last 72 hours. Urine analysis:    Component Value Date/Time   COLORURINE YELLOW 10/31/2016 0427   APPEARANCEUR HAZY (A) 10/31/2016 0427   LABSPEC 1.010 10/31/2016 0427   PHURINE 5.0 10/31/2016 0427   GLUCOSEU 150 (A) 10/31/2016 0427   HGBUR SMALL (A) 10/31/2016 0427   BILIRUBINUR NEGATIVE 10/31/2016 0427   KETONESUR NEGATIVE 10/31/2016 0427   PROTEINUR NEGATIVE 10/31/2016 0427   NITRITE NEGATIVE 10/31/2016 0427   LEUKOCYTESUR MODERATE (A) 10/31/2016 0427   Sepsis Labs: @LABRCNTIP (procalcitonin:4,lacticidven:4)  ) Recent Results (from the past 240 hour(s))  MRSA PCR Screening     Status: Abnormal   Collection Time: 11/01/16  3:36 AM  Result Value Ref Range Status   MRSA by PCR POSITIVE (A) NEGATIVE Final    Comment:        The GeneXpert MRSA Assay (FDA approved for NASAL specimens only), is one component of a comprehensive MRSA colonization surveillance program. It is not intended to diagnose MRSA infection nor to guide or monitor treatment for MRSA infections. RESULT CALLED TO, READ BACK BY AND VERIFIED WITH: IRBY,T RN [id number] AT 0629 SKEEN,P       Studies: No results found.  Scheduled Meds: . carvedilol   6.25 mg Oral Once  . Chlorhexidine Gluconate Cloth  6 each Topical Q0600  . heparin  5,000 Units Subcutaneous Q8H  . hydrALAZINE  25 mg Oral Q8H  . mupirocin ointment  1 application Nasal BID    Continuous Infusions: . sodium chloride 75 mL/hr at 11/02/16 0805     LOS: 2 days     Alma Friendly, MD [facility] Pager [phone]  If 7PM-7AM, please contact night-coverage [email or web address] Password [email or web address] 11/02/2016, 2:05 PM

## 2016-11-02 NOTE — Progress Notes (Signed)
Ventricular bigeminy noted on telemetry. There is no safe intervention for this arrhythmia that would not increase the risk of severe bradycardia.  PVCs are asymptomatic. No change in treatment plan.  Sanda Klein, MD, [facility] [phone] office [phone] pager

## 2016-11-03 DIAGNOSIS — L8990 Pressure ulcer of unspecified site, unspecified stage: Secondary | ICD-10-CM | POA: Insufficient documentation

## 2016-11-03 LAB — BASIC METABOLIC PANEL
Anion gap: 5 (ref 5–15)
BUN: 23 mg/dL — AB (ref 6–20)
CALCIUM: 7.5 mg/dL — AB (ref 8.9–10.3)
CHLORIDE: 111 mmol/L (ref 101–111)
CO2: 20 mmol/L — AB (ref 22–32)
CREATININE: 1.1 mg/dL (ref 0.61–1.24)
GFR calc Af Amer: >60 mL/min (ref >60)
GFR calc non Af Amer: 55 mL/min — ABNORMAL LOW (ref >60)
Glucose, Bld: 148 mg/dL — ABNORMAL HIGH (ref 65–99)
Potassium: 4.4 mmol/L (ref 3.5–5.1)
SODIUM: 136 mmol/L (ref 135–145)

## 2016-11-03 LAB — PATHOLOGIST SMEAR REVIEW

## 2016-11-03 MED ORDER — GUAIFENESIN 100 MG/5ML PO SOLN: 5.0000 mL | Freq: Four times a day (QID) | ORAL | Status: DC | PRN

## 2016-11-03 NOTE — Plan of Care (Signed)
Problem: Health Behavior/Discharge Planning: Goal: Ability to manage health-related needs will improve Outcome: Not Met (add Reason) Patient with advanced age and going to  Skilled nursing facility

## 2016-11-03 NOTE — Progress Notes (Signed)
Physical Therapy Treatment Patient Details Name: Frank Owens MRN: [id number] DOB: Nov 11, 1919 Today's Date: 11/03/2016    History of Present Illness Frank Owens is a 83 yo man with a PMH of HTN, dyslipidemia, leukemia, and anxiety/unspecified nervous system disorder who presented by EMS after being found unresponsive.    PT Comments    Patient required mod A for sit to stand transfer from recliner and mod A for safe ambulation with RW. Continue to progress as tolerated with anticipated d/c to SNF for further skilled PT services.     Follow Up Recommendations  SNF;Supervision/Assistance - 24 hour     Equipment Recommendations  Rolling walker with 5" wheels    Recommendations for Other Services       Precautions / Restrictions Precautions Precautions: Fall    Mobility  Bed Mobility               General bed mobility comments: pt OOB in chair upon arrival  Transfers Overall transfer level: Needs assistance Equipment used: Rolling walker (2 wheeled) Transfers: Sit to/from Stand Sit to Stand: Mod assist         General transfer comment: mod A to power up into standing from  recliner; cues for safe hand placement  Ambulation/Gait Ambulation/Gait assistance: Mod assist Ambulation Distance (Feet): 100 Feet Assistive device: Rolling walker (2 wheeled) Gait Pattern/deviations: Step-through pattern;Trunk flexed;Wide base of support;Decreased stride length;Shuffle;Drifts right/left     General Gait Details: assist for balance and management of RW; max cues for safe use of AD and multimodal cues for posture; increased assist for safety with directional changes and turning   Stairs            Wheelchair Mobility    Modified Rankin (Stroke Patients Only)       Balance Overall balance assessment: Needs assistance Sitting-balance support: No upper extremity supported;Feet supported Sitting balance-Leahy Scale: Fair     Standing balance support: Bilateral  upper extremity supported Standing balance-Leahy Scale: Poor                              Cognition Arousal/Alertness: Awake/alert Behavior During Therapy: WFL for tasks assessed/performed Overall Cognitive Status: No family/caregiver present to determine baseline cognitive functioning Area of Impairment: Safety/judgement;Awareness;Problem solving                         Safety/Judgement: Decreased awareness of deficits;Decreased awareness of safety Awareness: Emergent Problem Solving: Slow processing;Requires verbal cues;Difficulty sequencing General Comments: Pt telling unrelated stories; needs directional cues to navigate hallway and room      Exercises      General Comments General comments (skin integrity, edema, etc.): pt incontinent of stool when ambulating       Pertinent Vitals/Pain Pain Assessment: No/denies pain    Home Living                      Prior Function            PT Goals (current goals can now be found in the care plan section) Acute Rehab PT Goals PT Goal Formulation: With patient Time For Goal Achievement: 11/08/16 Potential to Achieve Goals: Good Progress towards PT goals: Progressing toward goals    Frequency    Min 3X/week      PT Plan Current plan remains appropriate    Co-evaluation  AM-PAC PT "6 Clicks" Daily Activity  Outcome Measure  Difficulty turning over in bed (including adjusting bedclothes, sheets and blankets)?: A Little Difficulty moving from lying on back to sitting on the side of the bed? : A Little Difficulty sitting down on and standing up from a chair with arms (e.g., wheelchair, bedside commode, etc,.)?: Unable Help needed moving to and from a bed to chair (including a wheelchair)?: A Little Help needed walking in hospital room?: A Lot Help needed climbing 3-5 steps with a railing? : A Lot 6 Click Score: 14    End of Session Equipment Utilized During  Treatment: Gait belt Activity Tolerance: Patient tolerated treatment well Patient left: in chair;with call bell/phone within reach;with chair alarm set Nurse Communication: Mobility status PT Visit Diagnosis: Unsteadiness on feet (R26.81);Muscle weakness (generalized) (M62.81);Other abnormalities of gait and mobility (R26.89)     Time: 2836-6294 PT Time Calculation (min) (ACUTE ONLY): 32 min  Charges:  $Gait Training: 8-22 mins $Therapeutic Activity: 8-22 mins                    G Codes:       Frank Owens, PTA Pager: [phone]     Frank Owens 11/03/2016, 4:09 PM

## 2016-11-03 NOTE — Plan of Care (Signed)
Problem: Education: Goal: Knowledge of [place] [facility] Education information/materials will improve Outcome: Progressing Explained to patient the purpose of hydralazine and how it helps to lower blood pressure. Patient understood it is different from the blood pressure medicine that he takes at home.

## 2016-11-03 NOTE — NC FL2 (Signed)
[place] MEDICAID FL2 LEVEL OF CARE SCREENING TOOL     IDENTIFICATION  Patient Name: Frank Owens Birthdate: [birth year] Sex: male Admission Date (Current Location): 10/31/2016  [facility] and [place] Number:  Herbalist and Address:  [facility], [place] [address] [place], [place] [address]      Provider Number: [id number]  Attending Physician Name and Address:  Alma Friendly, MD  Relative Name and Phone Number:  [name] [phone]    Current Level of Care: Hospital Recommended Level of Care: [place] Prior Approval Number:    Date Approved/Denied:   PASRR Number: [id number]  Discharge Plan: SNF    Current Diagnoses: Patient Active Problem List   Diagnosis Date Noted  . Pressure injury of skin 11/03/2016  . Hyperkalemia 10/31/2016  . AKI (acute kidney injury) ([place]) 10/31/2016  . CLL (chronic lymphocytic leukemia) ([place]) 10/31/2016  . HTN (hypertension) 10/31/2016  . Bradycardia     Orientation RESPIRATION BLADDER Height & Weight     Self, Time, Place  Normal Continent Weight: 155 lb 3.3 oz (70.4 kg) Height:  5\' 8"  (172.7 cm)  BEHAVIORAL SYMPTOMS/MOOD NEUROLOGICAL BOWEL NUTRITION STATUS      Incontinent Diet (heart room)  AMBULATORY STATUS COMMUNICATION OF NEEDS Skin   Limited Assist Verbally Normal                       Personal Care Assistance Level of Assistance  Bathing, Feeding, Dressing Bathing Assistance: Limited assistance Feeding assistance: Independent Dressing Assistance: Limited assistance     Functional Limitations Info  Sight, Hearing, Speech Sight Info: Adequate Hearing Info: Impaired (hard of hearing) Speech Info: Adequate    SPECIAL CARE FACTORS FREQUENCY  PT (By licensed PT), OT (By licensed OT)     PT Frequency: min of 2x wk OT Frequency: min of 2x wk            Contractures Contractures Info: Not present    Additional Factors Info  Code Status,  Allergies, Isolation Precautions Code Status Info: Full Code Allergies Info: NKA     Isolation Precautions Info: MRSA     Current Medications (11/03/2016):  This is the current hospital active medication list Current Facility-Administered Medications  Medication Dose Route Frequency Provider Last Rate Last Dose  . acetaminophen (TYLENOL) tablet 650 mg  650 mg Oral Q6H PRN Etta Quill, DO       Or  . acetaminophen (TYLENOL) suppository 650 mg  650 mg Rectal Q6H PRN Etta Quill, DO      . atropine 1 MG/10ML injection 0.5 mg  0.5 mg Intravenous Q5 Min x 3 PRN Cardama, Grayce Sessions, MD   0.5 mg at 10/31/16 1928  . Chlorhexidine Gluconate Cloth 2 % PADS 6 each  6 each Topical Q0600 Alma Friendly, MD   6 each at 11/03/16 0500  . heparin injection 5,000 Units  5,000 Units Subcutaneous Q8H Jennette Kettle M, DO   5,000 Units at 11/03/16 0501  . hydrALAZINE (APRESOLINE) injection 10 mg  10 mg Intravenous Q6H PRN Alma Friendly, MD      . hydrALAZINE (APRESOLINE) tablet 25 mg  25 mg Oral Q8H Alma Friendly, MD   25 mg at 11/03/16 0506  . mupirocin ointment (BACTROBAN) 2 % 1 application  1 application Nasal BID Alma Friendly, MD   1 application at 72/09/47 [phone]  . ondansetron (ZOFRAN) tablet 4 mg  4 mg Oral  Q6H PRN Etta Quill, DO       Or  . ondansetron [facility]) injection 4 mg  4 mg Intravenous Q6H PRN Etta Quill, DO         Discharge Medications: Please see discharge summary for a list of discharge medications.  Relevant Imaging Results:  Relevant Lab Results:   Additional Information SS# [id number]  Wende Neighbors, LCSW

## 2016-11-03 NOTE — Clinical Social Work Note (Signed)
Clinical Social Work Assessment  Patient Details  Name: Frank Owens MRN: [id number] Date of Birth: 22-Jul-1919  Date of referral:  11/03/16               Reason for consult:  Discharge Planning, Facility Placement                Permission sought to share information with:  Family Supports Permission granted to share information::  Yes, Verbal Permission Granted  Name::     Frank Owens  Agency::  snf  Relationship::  niece  Contact Information:  [phone]  Housing/Transportation Living arrangements for the past 2 months:  [place] of Information:  Patient Patient Interpreter Needed:  None Criminal Activity/Legal Involvement Pertinent to Current Situation/Hospitalization:  No - Comment as needed Significant Relationships:  Other Family Members Lives with:  Self Do you feel safe going back to the place where you live?  Yes Need for family participation in patient care:  Yes (Comment)  Care giving concerns:  Patient lives alone and was found to be unconscious by family. No family at bedside  Social Worker assessment / plan: Clinical Social Worker met patient at bedside to offer support and discuss disposition plan. Patient extremely hard of hearing. CSW spoke to patient about SNF and patient stated he is willing to go but would like to be closer to niece/nephew. Patient stated he would like niece to pick facility for him.  CSW reached out to patients niece [name] via phone. [name] stated she is in agreement with patient. [name] stated she would prefer a SNF in [place] so she would be able to check in on patient regularly. CSW to follow with family once bed offers are available    Employment status:  Retired Nurse, adult PT Recommendations:  [place] / Referral to community resources:  [place]  Patient/Family's Response to care:  Patient content with disposition plan    Patient/Family's Understanding of and Emotional Response to Diagnosis, Current Treatment, and Prognosis:  Patient and family has understanding of patients limitations and very supportive of patient   Emotional Assessment Appearance:  Appears stated age Attitude/Demeanor/Rapport:  Other Affect (typically observed):  Appropriate, Pleasant Orientation:  Oriented to  Time, Oriented to Place, Oriented to Self, Oriented to Situation Alcohol / Substance use:  Not Applicable Psych involvement (Current and /or in the community):  No (Comment)  Discharge Needs  Concerns to be addressed:  No discharge needs identified Readmission within the last 30 days:  No Current discharge risk:  Lives alone Barriers to Discharge:  No Barriers Identified   Wende Neighbors, LCSW 11/03/2016, 12:00 PM

## 2016-11-03 NOTE — Progress Notes (Signed)
Occupational Therapy Treatment Patient Details Name: Frank Owens MRN: [id number] DOB: 06-23-1919 Today's Date: 11/03/2016    History of present illness Frank Owens is a 83 yo man with a PMH of HTN, dyslipidemia, leukemia, and anxiety/unspecified nervous system disorder who presented by EMS after being found unresponsive.   OT comments  Pt demonstrating progress toward OT goals. He continues to require VC's for sequencing and safety with ADL tasks and was able to complete oral care tasks seated with supervision this session. He was able to complete simulated toilet transfers with mod assist for power up and min assist for stability once on his feet. Pt very tangential throughout session requiring frequent redirection. D/C recommendation remains appropriate.   Follow Up Recommendations  SNF    Equipment Recommendations  Other (comment) (defer to next venue of care)    Recommendations for Other Services      Precautions / Restrictions Precautions Precautions: Fall Restrictions Weight Bearing Restrictions: No       Mobility Bed Mobility               General bed mobility comments: OOB in recliner on my arrival.  Transfers Overall transfer level: Needs assistance Equipment used: Rolling walker (2 wheeled) Transfers: Sit to/from Stand Sit to Stand: Mod assist         General transfer comment: Mod A to power up into standing from  recliner; cues for safe hand placement. Min assist once ambulating.     Balance Overall balance assessment: Needs assistance Sitting-balance support: No upper extremity supported;Feet supported Sitting balance-Leahy Scale: Fair     Standing balance support: Bilateral upper extremity supported Standing balance-Leahy Scale: Poor Standing balance comment: Heavily reliant on B UE support.                            ADL either performed or assessed with clinical judgement   ADL Overall ADL's : Needs assistance/impaired      Grooming: Oral care;Supervision/safety;Sitting Grooming Details (indicate cue type and reason): VC's for sequencing.                  Toilet Transfer: Ambulation;Cueing for sequencing;Cueing for safety;RW;Moderate assistance Toilet Transfer Details (indicate cue type and reason): Mod assist for power-up. Min assist for stability once on feet. Simulated in room. Pt unsteady and only able to tolerate taking a few steps.          Functional mobility during ADLs: Minimal assistance;Rolling walker;Cueing for safety;Cueing for sequencing;Moderate assistance General ADL Comments: Pt willing to participate in ADL tasks with cueing for sequencing and safety.      Vision       Perception     Praxis      Cognition Arousal/Alertness: Awake/alert Behavior During Therapy: WFL for tasks assessed/performed Overall Cognitive Status: No family/caregiver present to determine baseline cognitive functioning Area of Impairment: Safety/judgement;Awareness;Problem solving                       Following Commands: Follows one step commands with increased time;Follows one step commands consistently Safety/Judgement: Decreased awareness of deficits;Decreased awareness of safety Awareness: Emergent Problem Solving: Slow processing;Requires verbal cues;Difficulty sequencing General Comments: Pt very tangential throughout session and speaking off topic when asked questions.         Exercises     Shoulder Instructions       General Comments pt incontinent of stool when ambulating     Pertinent  Vitals/ Pain       Pain Assessment: No/denies pain  Home Living                                          Prior Functioning/Environment              Frequency  Min 2X/week        Progress Toward Goals  OT Goals(current goals can now be found in the care plan section)  Progress towards OT goals: Progressing toward goals  Acute Rehab OT Goals Patient Stated  Goal: to get stronger so I don't fall OT Goal Formulation: With patient Time For Goal Achievement: 11/15/16 Potential to Achieve Goals: Good  Plan Discharge plan remains appropriate    Co-evaluation                 AM-PAC PT "6 Clicks" Daily Activity     Outcome Measure   Help from another person eating meals?: A Little Help from another person taking care of personal grooming?: A Little Help from another person toileting, which includes using toliet, bedpan, or urinal?: A Little Help from another person bathing (including washing, rinsing, drying)?: A Little Help from another person to put on and taking off regular upper body clothing?: None Help from another person to put on and taking off regular lower body clothing?: A Little 6 Click Score: 19    End of Session Equipment Utilized During Treatment: Gait belt;Rolling walker  OT Visit Diagnosis: Unsteadiness on feet (R26.81);History of falling (Z91.81)   Activity Tolerance Patient tolerated treatment well   Patient Left in chair;with call bell/phone within reach;with chair alarm set   Nurse Communication Mobility status        Time: 4166-0630 OT Time Calculation (min): 29 min  Charges: OT General Charges $OT Visit: 1 Visit OT Treatments $Self Care/Home Management : 23-37 mins  Norman Herrlich, MS OTR/L  Pager: [place] A Paitlyn Mcclatchey 11/03/2016, 4:27 PM

## 2016-11-03 NOTE — Care Management Important Message (Signed)
Important Message  Patient Details  Name: Frank Owens MRN: [id number] Date of Birth: Sep 22, 1919   Medicare Important Message Given:  Yes    Carles Collet, RN 11/03/2016, 10:08 AM

## 2016-11-03 NOTE — Progress Notes (Signed)
PROGRESS NOTE  Frank Owens ZOX:[id number] DOB: [birth year] DOA: 10/31/2016 PCP: [facility]  HPI/Recap of past 60 hours: 83 year old male with past medical history significant for hypertension, diabetes, CLL, is currently managed for bradycardia after family found him unresponsive in his home as well as hyperkalemia with potassium 7.5 and AK I.  Today, heart rate noted to be in the 100s, asymptomatic.  Patient is difficult to communicate with, as he goes off tangents and speech mumbled.  Review of systems not done, although patient looks comfortable.   Assessment/Plan: Principal Problem:   Bradycardia Active Problems:   Hyperkalemia   AKI (acute kidney injury) ([place])   CLL (chronic lymphocytic leukemia) ([facility])   HTN (hypertension)   Pressure injury of skin  #Bradycardia Resolved, currently more tachycardic ??Tachy-brady syndrome versus med induced (patient on 25 mg twice daily of Coreg, diltiazem at home) Cardiology on board, continue to hold Coreg and diltiazem, discontinue upon discharge Given his overall functionality, family does not want any procedures including permanent pacemaker  Monitor on telemetry  #Hyperkalemia Fluctuating, unknown etiology Daily BMP Telemetry  #AK I Resolved Likely secondary to poor oral intake Ultrasound renal suggestive of chronic renal disease  #Hypertension Controlled Continue p.o. hydralazine, patient can be discharged on hydralazine  #CLL Stable  #Disposition plans: Patient has a bed available tomorrow in SNF at . I spoke to Ms [name] (niece, [facility] [phone]), who was in agreement to changing code status to DNR, to avoid frequent hospital admission due to his possible brady/tachy syndrome. Ms Atilano Median wants to speak with her brother overnight and get in touch with [place] for change of status. Hospitalist to please kindly call Ms [name] (niece) to discuss about code status if possible before patient  is transferred to SNF.     Code Status: Full  Family Communication: Ms [name]  Disposition Plan: SNF   Consultants:  Cardiology  Procedures:  Transcutaneous pacing  Antimicrobials:  None  DVT prophylaxis:  Heparin   Objective: Vitals:   11/03/16 0506 11/03/16 0900 11/03/16 1100 11/03/16 1700  BP: (!) 148/97 131/88  (!) 147/75  Pulse:  (!) 118 (!) 31 99  Resp:  (!) 30 (!) 29 16  Temp:      TempSrc:      SpO2: 97% 96%  97%  Weight:      Height:        Intake/Output Summary (Last 24 hours) at 11/03/16 1726 Last data filed at 11/03/16 1700  Gross per 24 hour  Intake             3105 ml  Output             1050 ml  Net             2055 ml   Filed Weights   10/31/16 1915 11/01/16 0110  Weight: 79.4 kg (175 lb) 70.4 kg (155 lb 3.3 oz)    Exam:   General: Alert, awake, talkative, looks comfortable  Cardiovascular: Heart sounds S1-S2 present, no added sounds  Respiratory: Chest clear bilaterally  Abdomen: Soft, non-distended, non tender  Musculoskeletal: No pedal edema  Skin: Normal  Psychiatry: Talkative, pleasant   Data Reviewed: CBC:  Recent Labs Lab 10/31/16 1925 10/31/16 1935 11/01/16 0239 11/02/16 0243  WBC 171.1*  --  128.9* 102.8*  NEUTROABS 8.6*  --   --  6.2  HGB 11.3* 11.9* 11.4* 11.0*  HCT 36.4* 35.0* 35.2* 34.4*  MCV 100.6*  --  97.5 97.5  PLT 239  --  185 193   Basic Metabolic Panel:  Recent Labs Lab 10/31/16 2145 11/01/16 0239 11/01/16 1020 11/01/16 1320 11/02/16 0243 11/02/16 1403 11/03/16 0215  NA 129* 130*  --   --  136 135 136  K 5.7* 5.3*  6.0* 5.5* 4.5 5.4* 5.7* 4.4  CL 99* 99*  --   --  108 109 111  CO2 22 23  --   --  22 19* 20*  GLUCOSE 166* 114*  --   --  112* 120* 148*  BUN 36* 31*  --   --  20 22* 23*  CREATININE 1.96* 1.68*  --   --  1.09 1.22 1.10  CALCIUM 8.2* 8.1*  --   --  7.7* 7.8* 7.5*  MG  --   --   --   --   --  2.3  --    GFR: Estimated Creatinine Clearance: 38.9 mL/min (by  C-G formula based on SCr of 1.1 mg/dL). Liver Function Tests:  Recent Labs Lab 10/31/16 1925  AST 36  ALT 14*  ALKPHOS 82  BILITOT 1.2  PROT 5.5*  ALBUMIN 3.1*   No results for input(s): LIPASE, AMYLASE in the last 168 hours. No results for input(s): AMMONIA in the last 168 hours. Coagulation Profile: No results for input(s): INR, PROTIME in the last 168 hours. Cardiac Enzymes:  Recent Labs Lab 10/31/16 1925 10/31/16 2310  CKTOTAL  --  320  TROPONINI 0.04*  --    BNP (last 3 results) No results for input(s): PROBNP in the last 8760 hours. HbA1C: No results for input(s): HGBA1C in the last 72 hours. CBG: No results for input(s): GLUCAP in the last 168 hours. Lipid Profile: No results for input(s): CHOL, HDL, LDLCALC, TRIG, CHOLHDL, LDLDIRECT in the last 72 hours. Thyroid Function Tests:  Recent Labs  10/31/16 2313  TSH 4.842*   Anemia Panel: No results for input(s): VITAMINB12, FOLATE, FERRITIN, TIBC, IRON, RETICCTPCT in the last 72 hours. Urine analysis:    Component Value Date/Time   COLORURINE YELLOW 10/31/2016 0427   APPEARANCEUR HAZY (A) 10/31/2016 0427   LABSPEC 1.010 10/31/2016 0427   PHURINE 5.0 10/31/2016 0427   GLUCOSEU 150 (A) 10/31/2016 0427   HGBUR SMALL (A) 10/31/2016 0427   BILIRUBINUR NEGATIVE 10/31/2016 0427   KETONESUR NEGATIVE 10/31/2016 0427   PROTEINUR NEGATIVE 10/31/2016 0427   NITRITE NEGATIVE 10/31/2016 0427   LEUKOCYTESUR MODERATE (A) 10/31/2016 0427   Sepsis Labs: @LABRCNTIP (procalcitonin:4,lacticidven:4)  ) Recent Results (from the past 240 hour(s))  MRSA PCR Screening     Status: Abnormal   Collection Time: 11/01/16  3:36 AM  Result Value Ref Range Status   MRSA by PCR POSITIVE (A) NEGATIVE Final    Comment:        The GeneXpert MRSA Assay (FDA approved for NASAL specimens only), is one component of a comprehensive MRSA colonization surveillance program. It is not intended to diagnose MRSA infection nor to guide  or monitor treatment for MRSA infections. RESULT CALLED TO, READ BACK BY AND VERIFIED WITH: IRBY,T RN [phone] AT 0629 SKEEN,P       Studies: Ct Angio Chest Pe W Or Wo Contrast  Result Date: 11/02/2016 CLINICAL DATA:  PE suspected, high pretest probability. Hypertension, diabetes, CLL, bradycardia. EXAM: CT ANGIOGRAPHY CHEST WITH CONTRAST TECHNIQUE: Multidetector CT imaging of the chest was performed using the standard protocol during bolus administration of intravenous contrast. Multiplanar CT image reconstructions and MIPs were obtained to evaluate  the vascular anatomy. CONTRAST:  100 cc Isovue 370 COMPARISON:  None. FINDINGS: Cardiovascular: The peripheral segmental and subsegmental pulmonary artery branches to the lower lobes cannot be characterized due to extensive patient breathing motion artifact. There is no pulmonary embolism identified within the main, central lobar or upper lobe segmental pulmonary artery branches bilaterally. Heart size is upper normal. No pericardial effusion. Coronary artery calcifications noted. Aortic atherosclerosis. No aortic aneurysm. Mediastinum/Nodes: Esophagus is unremarkable. No mass or enlarged lymph nodes within the mediastinum or perihilar regions. Lungs/Pleura: Patchy bibasilar consolidations, pneumonia versus atelectasis. Additional focal dense consolidation within the right lung apex, with associated ground-glass opacity. Small bilateral pleural effusions. Upper Abdomen: No acute findings. Musculoskeletal: Degenerative changes of the kyphotic thoracolumbar spine, mild to moderate in degree. No acute or suspicious osseous finding. Review of the MIP images confirms the above findings. IMPRESSION: 1. Study is significantly limited by patient breathing motion artifact. The segmental and subsegmental pulmonary artery branch to the lower lobes cannot be characterized due to this extensive motion artifact. There is no central obstructing pulmonary embolism within the  main, lobar or upper lobe segmental pulmonary artery branches. 2. Patchy bibasilar consolidations, pneumonia versus atelectasis. Small adjacent pleural effusions. 3. Additional small nodular consolidation at the right lung apex with surrounding ground-glass opacity, of uncertain etiology, favor chronic/benign. Consider follow-up chest CT 3-6 months to ensure stability or resolution. 4. Heart size is upper normal.  Coronary artery calcifications. 5. Aortic atherosclerosis.  No aortic aneurysm. Electronically Signed   By: Franki Cabot M.D.   On: 11/02/2016 20:49    Scheduled Meds: . Chlorhexidine Gluconate Cloth  6 each Topical Q0600  . heparin  5,000 Units Subcutaneous Q8H  . hydrALAZINE  25 mg Oral Q8H  . mupirocin ointment  1 application Nasal BID    Continuous Infusions:   LOS: 3 days     Alma Friendly, MD [facility] Pager [phone]  If 7PM-7AM, please contact night-coverage [email or web address] Password [email or web address] 11/03/2016, 5:26 PM

## 2016-11-04 DIAGNOSIS — E875 Hyperkalemia: Secondary | ICD-10-CM

## 2016-11-04 DIAGNOSIS — C9190 Lymphoid leukemia, unspecified not having achieved remission: Secondary | ICD-10-CM

## 2016-11-04 DIAGNOSIS — I10 Essential (primary) hypertension: Secondary | ICD-10-CM

## 2016-11-04 DIAGNOSIS — N179 Acute kidney failure, unspecified: Secondary | ICD-10-CM

## 2016-11-04 MED ORDER — HYDRALAZINE HCL 25 MG PO TABS: 25.0000 mg | ORAL_TABLET | Freq: Three times a day (TID) | ORAL | 0 refills | Status: AC

## 2016-11-04 MED ORDER — CARVEDILOL 3.125 MG PO TABS: 3.1250 mg | ORAL_TABLET | Freq: Two times a day (BID) | ORAL | 0 refills | Status: DC

## 2016-11-04 MED ORDER — POLYETHYLENE GLYCOL 3350 17 G PO PACK: 17.0000 g | PACK | Freq: Every day | ORAL | 0 refills | Status: DC | PRN

## 2016-11-04 NOTE — Discharge Summary (Signed)
Physician Discharge Summary  Frank Owens IRS:[id number] DOB: 1919/03/15 DOA: 10/31/2016  PCP: [place], [place] date: 10/31/2016 Discharge date: 11/04/2016  Admitted From: Home Disposition:  Nursing home  Recommendations for Outpatient Follow-up:  1. Follow up with nursing home provider at earliest convenience with repeat BMP in 5-7 days 2. Patient would benefit from hospice evaluation in the nursing home if  condition worsens    Home Health: No  Equipment/Devices: None  Discharge Condition: Guarded CODE STATUS: DO NOT RESUSCITATE Diet recommendation: Heart Healthy  Brief/Interim Summary: 83 year old male with history of hypertension, diabetes, CLL presented with unresponsive and was found to be bradycardic and hyperkalemic with potassium of 7.5 and acute kidney injury. Patient was started on transcutaneous pacing. His beta blockers and Cardizem were held. Cardiology evaluated the patient. Family members decided to not pursue further interventions including pacemaker placement. Hyperkalemia resolved and acute kidney injury improved.   Discharge Diagnoses:  Principal Problem:   Bradycardia Active Problems:   Hyperkalemia   AKI (acute kidney injury) ([place])   CLL (chronic lymphocytic leukemia) ([facility])   HTN (hypertension)   Pressure injury of skin  Bradycardia Resolved, currently more tachycardic ??Tachy-brady syndrome versus med induced (patient was on 25 mg twice daily of Coreg and 240 mg of diltiazem at home) Status post transcutaneous pacemaker in the ED which was discontinued off her family discussion. Family members don't want any form of further intervention including pacemaker placement. Cardiology has already evaluated the patient. Cardizem and Coreg remain on hold. Patient is currently tachycardic. Will start Coreg at 3.125 mg twice a day and titrate as needed or stop it if patient becomes bradycardic again. - Because of advanced age and poor  functional status, if patient's conditions worsens then consider hospice evaluation in the nursing home. - After discussion with Mrs. Donney Cheek/niece on phone, patient's CODE STATUS will be changed to DO NOT RESUSCITATE. - Discharge to nursing home today  Hyperkalemia -unknown etiology -Resolved -Outpatient follow-up  AK I Resolved Likely secondary to poor oral intake Ultrasound renal suggestive of chronic renal disease -Outpatient follow-up  Hypertension Controlled -Continue hydralazine along with low-dose of Coreg.  CLL with leukocytosis Stable - Outpatient follow-up  Discharge Instructions  Discharge Instructions    Call MD for:  difficulty breathing, headache or visual disturbances    Complete by:  As directed    Call MD for:  extreme fatigue    Complete by:  As directed    Call MD for:  hives    Complete by:  As directed    Call MD for:  persistant dizziness or light-headedness    Complete by:  As directed    Call MD for:  persistant nausea and vomiting    Complete by:  As directed    Call MD for:  severe uncontrolled pain    Complete by:  As directed    Call MD for:  temperature >100.4    Complete by:  As directed    Diet - low sodium heart healthy    Complete by:  As directed    Discharge instructions    Complete by:  As directed    Fall precautions   Increase activity slowly    Complete by:  As directed      Allergies as of 11/04/2016   No Known Allergies     Medication List    STOP taking these medications   diazepam 5 MG tablet Commonly known as:  VALIUM   diltiazem  240 MG 24 hr capsule Commonly known as:  TIAZAC   polyethylene glycol powder powder Commonly known as:  GLYCOLAX/MIRALAX Replaced by:  polyethylene glycol packet     TAKE these medications   atorvastatin 20 MG tablet Commonly known as:  LIPITOR Take 20 mg by mouth daily.   carvedilol 3.125 MG tablet Commonly known as:  COREG Take 1 tablet (3.125 mg total) by mouth 2  (two) times daily. What changed:  medication strength  how much to take  when to take this   hydrALAZINE 25 MG tablet Commonly known as:  APRESOLINE Take 1 tablet (25 mg total) by mouth every 8 (eight) hours.   polyethylene glycol packet Commonly known as:  MIRALAX / GLYCOLAX Take 17 g by mouth daily as needed for moderate constipation. Replaces:  polyethylene glycol powder powder      Contact information for after-discharge care    [facility] Follow up.   Specialty:  [place] Contact information: [place] [phone]             No Known Allergies  Consultations:  Cardiology   Procedures/Studies: Dg Lumbar Spine 2-3 Views  Result Date: 10/16/2016 CLINICAL DATA:  Fall.  Back pain. EXAM: LUMBAR SPINE - 2-3 VIEW COMPARISON:  None. FINDINGS: There is a compression fracture of L1 involving the inferior endplate. There is 20% loss of height. There is upward bowing. Osteopenia. Anatomic alignment. There is moderate narrowing of the L2-3, L4-5, and L5-S1 disc. Facet arthropathy at L5-S1. Aortic vascular calcifications are noted. IMPRESSION: L1 compression fracture. Age is indeterminate. MRI is recommended to further delineate. Electronically Signed   By: Marybelle Killings M.D.   On: 10/16/2016 13:19   Ct Angio Chest Pe W Or Wo Contrast  Result Date: 11/02/2016 CLINICAL DATA:  PE suspected, high pretest probability. Hypertension, diabetes, CLL, bradycardia. EXAM: CT ANGIOGRAPHY CHEST WITH CONTRAST TECHNIQUE: Multidetector CT imaging of the chest was performed using the standard protocol during bolus administration of intravenous contrast. Multiplanar CT image reconstructions and MIPs were obtained to evaluate the vascular anatomy. CONTRAST:  100 cc Isovue 370 COMPARISON:  None. FINDINGS: Cardiovascular: The peripheral segmental and subsegmental pulmonary artery branches to the lower lobes  cannot be characterized due to extensive patient breathing motion artifact. There is no pulmonary embolism identified within the main, central lobar or upper lobe segmental pulmonary artery branches bilaterally. Heart size is upper normal. No pericardial effusion. Coronary artery calcifications noted. Aortic atherosclerosis. No aortic aneurysm. Mediastinum/Nodes: Esophagus is unremarkable. No mass or enlarged lymph nodes within the mediastinum or perihilar regions. Lungs/Pleura: Patchy bibasilar consolidations, pneumonia versus atelectasis. Additional focal dense consolidation within the right lung apex, with associated ground-glass opacity. Small bilateral pleural effusions. Upper Abdomen: No acute findings. Musculoskeletal: Degenerative changes of the kyphotic thoracolumbar spine, mild to moderate in degree. No acute or suspicious osseous finding. Review of the MIP images confirms the above findings. IMPRESSION: 1. Study is significantly limited by patient breathing motion artifact. The segmental and subsegmental pulmonary artery branch to the lower lobes cannot be characterized due to this extensive motion artifact. There is no central obstructing pulmonary embolism within the main, lobar or upper lobe segmental pulmonary artery branches. 2. Patchy bibasilar consolidations, pneumonia versus atelectasis. Small adjacent pleural effusions. 3. Additional small nodular consolidation at the right lung apex with surrounding ground-glass opacity, of uncertain etiology, favor chronic/benign. Consider follow-up chest CT 3-6 months to ensure stability or resolution. 4. Heart  size is upper normal.  Coronary artery calcifications. 5. Aortic atherosclerosis.  No aortic aneurysm. Electronically Signed   By: Franki Cabot M.D.   On: 11/02/2016 20:49   US Renal  Result Date: 11/01/2016 CLINICAL DATA:  Acute kidney injury. History of hypertension and diabetes. EXAM: RENAL / URINARY TRACT ULTRASOUND COMPLETE COMPARISON:  None.  FINDINGS: Right Kidney: Length: 11.2 cm. Diffuse parenchymal thinning consistent with chronic renal disease. Cyst in the lower pole the right kidney measuring 1.2 cm maximal diameter. Shadowing stone in the right lower pole measuring 6 mm diameter. No hydronephrosis. Left Kidney: Length: 11.3 cm. Diffuse parenchymal thinning consistent with chronic renal disease. No focal lesions are identified. No hydronephrosis. Bladder: The bladder wall is diffusely thickened and somewhat irregular. This may be due to cystitis or hypertrophy from outlet obstruction. No intraluminal filling defects. The prostate gland is enlarged, measuring 4.3 cm maximal dimension and causing impression on the bladder base. IMPRESSION: 1. Bilateral renal parenchymal thinning consistent with chronic renal disease. No hydronephrosis. 2. Small cyst and stone demonstrated in the lower pole right kidney. 3. Diffusely thickened and irregular bladder wall may be due to cystitis or hypertrophy. 4. Enlarged prostate gland. Electronically Signed   By: Lucienne Capers M.D.   On: 11/01/2016 00:14   Dg Chest Port 1 View  Result Date: 10/31/2016 CLINICAL DATA:  Acute mental status change. EXAM: PORTABLE CHEST 1 VIEW COMPARISON:  None. FINDINGS: No pneumothorax. The lateral left lung base is obscured by a transcutaneous pacer. The heart is borderline. The hila are symmetric. A torturous thoracic aorta is identified. No mediastinal abnormalities are noted. No nodules, masses, or focal infiltrates. IMPRESSION: No active disease. Electronically Signed   By: Dorise Bullion III M.D   On: 10/31/2016 19:47     Patient has transcutaneous pacing done in the ED which was subsequently turned off  Subjective: Patient seen and examined at bedside. He is awake but is a poor historian. He denies any current chest pain. No overnight fever or vomiting.  Discharge Exam: Vitals:   11/04/16 0535 11/04/16 0829  BP: (!) 177/74 (!) 167/78  Pulse: 75 (!) 116   Resp: (!) 21 (!) 25  Temp:  97.9 F (36.6 C)  SpO2:  95%   Vitals:   11/04/16 0007 11/04/16 0408 11/04/16 0535 11/04/16 0829  BP:  (!) 176/75 (!) 177/74 (!) 167/78  Pulse:   75 (!) 116  Resp:   (!) 21 (!) 25  Temp: 97.9 F (36.6 C) (!) 97.4 F (36.3 C)  97.9 F (36.6 C)  TempSrc: Axillary Oral  Oral  SpO2:  97%  95%  Weight:      Height:        General: Pt is  awake, not in acute distress Cardiovascular: Tachycardic, S1/S2 + Respiratory: Bilateral decreased breath sounds at bases  Abdominal: Soft, NT, ND, bowel sounds + Extremities: Trace edema, no cyanosis    The results of significant diagnostics from this hospitalization (including imaging, microbiology, ancillary and laboratory) are listed below for reference.     Microbiology: Recent Results (from the past 240 hour(s))  MRSA PCR Screening     Status: Abnormal   Collection Time: 11/01/16  3:36 AM  Result Value Ref Range Status   MRSA by PCR POSITIVE (A) NEGATIVE Final    Comment:        The GeneXpert MRSA Assay (FDA approved for NASAL specimens only), is one component of a comprehensive MRSA colonization surveillance program. It is not intended  to diagnose MRSA infection nor to guide or monitor treatment for MRSA infections. RESULT CALLED TO, READ BACK BY AND VERIFIED WITH: IRBY,T RN [id number] AT 2778 SKEEN,P      Labs: BNP (last 3 results) No results for input(s): BNP in the last 8760 hours. Basic Metabolic Panel:  Recent Labs Lab 10/31/16 2145 11/01/16 0239 11/01/16 1020 11/01/16 1320 11/02/16 0243 11/02/16 1403 11/03/16 0215  NA 129* 130*  --   --  136 135 136  K 5.7* 5.3*  6.0* 5.5* 4.5 5.4* 5.7* 4.4  CL 99* 99*  --   --  108 109 111  CO2 22 23  --   --  22 19* 20*  GLUCOSE 166* 114*  --   --  112* 120* 148*  BUN 36* 31*  --   --  20 22* 23*  CREATININE 1.96* 1.68*  --   --  1.09 1.22 1.10  CALCIUM 8.2* 8.1*  --   --  7.7* 7.8* 7.5*  MG  --   --   --   --   --  2.3  --    Liver  Function Tests:  Recent Labs Lab 10/31/16 1925  AST 36  ALT 14*  ALKPHOS 82  BILITOT 1.2  PROT 5.5*  ALBUMIN 3.1*   No results for input(s): LIPASE, AMYLASE in the last 168 hours. No results for input(s): AMMONIA in the last 168 hours. CBC:  Recent Labs Lab 10/31/16 1925 10/31/16 1935 11/01/16 0239 11/02/16 0243  WBC 171.1*  --  128.9* 102.8*  NEUTROABS 8.6*  --   --  6.2  HGB 11.3* 11.9* 11.4* 11.0*  HCT 36.4* 35.0* 35.2* 34.4*  MCV 100.6*  --  97.5 97.5  PLT 239  --  185 161   Cardiac Enzymes:  Recent Labs Lab 10/31/16 1925 10/31/16 2310  CKTOTAL  --  320  TROPONINI 0.04*  --    BNP: Invalid input(s): POCBNP CBG: No results for input(s): GLUCAP in the last 168 hours. D-Dimer No results for input(s): DDIMER in the last 72 hours. Hgb A1c No results for input(s): HGBA1C in the last 72 hours. Lipid Profile No results for input(s): CHOL, HDL, LDLCALC, TRIG, CHOLHDL, LDLDIRECT in the last 72 hours. Thyroid function studies No results for input(s): TSH, T4TOTAL, T3FREE, THYROIDAB in the last 72 hours.  Invalid input(s): FREET3 Anemia work up No results for input(s): VITAMINB12, FOLATE, FERRITIN, TIBC, IRON, RETICCTPCT in the last 72 hours. Urinalysis    Component Value Date/Time   COLORURINE YELLOW 10/31/2016 0427   APPEARANCEUR HAZY (A) 10/31/2016 0427   LABSPEC 1.010 10/31/2016 0427   PHURINE 5.0 10/31/2016 0427   GLUCOSEU 150 (A) 10/31/2016 0427   HGBUR SMALL (A) 10/31/2016 0427   BILIRUBINUR NEGATIVE 10/31/2016 0427   KETONESUR NEGATIVE 10/31/2016 0427   PROTEINUR NEGATIVE 10/31/2016 0427   NITRITE NEGATIVE 10/31/2016 0427   LEUKOCYTESUR MODERATE (A) 10/31/2016 0427   Sepsis Labs Invalid input(s): PROCALCITONIN,  WBC,  LACTICIDVEN Microbiology Recent Results (from the past 240 hour(s))  MRSA PCR Screening     Status: Abnormal   Collection Time: 11/01/16  3:36 AM  Result Value Ref Range Status   MRSA by PCR POSITIVE (A) NEGATIVE Final     Comment:        The GeneXpert MRSA Assay (FDA approved for NASAL specimens only), is one component of a comprehensive MRSA colonization surveillance program. It is not intended to diagnose MRSA infection nor to guide or monitor treatment for MRSA  infections. RESULT CALLED TO, READ BACK BY AND VERIFIED WITH: IRBY,T RN [id number] AT 0629 SKEEN,P      Time coordinating discharge: 35 minutes  SIGNED:   Aline August, MD  [facility] 11/04/2016, 8:51 AM Pager: [phone]  If 7PM-7AM, please contact night-coverage [email or web address] Password [email or web address]

## 2016-11-04 NOTE — Progress Notes (Signed)
Clinical Social Worker facilitated patient discharge including contacting patient family and facility to confirm patient discharge plans.  Clinical information faxed to facility and family agreeable with plan.  CSW arranged ambulance transport via [facility] to [facility] care .  RN Lenna Sciara to call [phone] and ask for the RN that has room 33B for report prior to discharge.  Clinical Social Worker will sign off for now as social work intervention is no longer needed. Please consult [place] again if new need arises.  Rhea Pink, MSW, [place]

## 2017-03-01 ENCOUNTER — Ambulatory Visit: Payer: Self-pay | Admitting: Internal Medicine

## 2018-01-19 ENCOUNTER — Encounter: Payer: Self-pay | Admitting: Nurse Practitioner

## 2018-01-19 ENCOUNTER — Non-Acute Institutional Stay: Payer: Medicare Other | Admitting: Nurse Practitioner

## 2018-01-19 VITALS — BP 155/78 | HR 75 | Temp 97.5°F | Resp 20 | Ht 68.0 in | Wt 149.5 lb

## 2018-01-19 DIAGNOSIS — R413 Other amnesia: Secondary | ICD-10-CM | POA: Insufficient documentation

## 2018-01-19 DIAGNOSIS — R630 Anorexia: Secondary | ICD-10-CM

## 2018-01-19 DIAGNOSIS — Z515 Encounter for palliative care: Secondary | ICD-10-CM

## 2018-01-19 NOTE — Progress Notes (Signed)
[facility] Telephone: [phone] Fax: [phone]  PATIENT NAME: Frank Owens DOB: 1919/08/20 MRN: [id number]  PRIMARY CARE PROVIDER:  Dr Margarita Rana PROVIDER:  Dr. [facility] RESPONSIBLE PARTY:   [name] niece [place] [phone]  ASSESSMENT:      I visited and observed Frank Owens. We talked about purpose for palliative medicine visit. We talked about symptoms of pain and shortness of breath which he denies. We talked about his appetite. We talked about foods and he likes to eat. We talked about this functional level and importance of asking for help with transferring to wheelchair. We talked about the importance of socialization and going to activities. We talked about upcoming Ophthalmology appointment for cataracts removal. He was limited with verbal discussion and understanding. Talked about role of palliative care. Frank Owens expressed he was thankful for palliative care visit and support. Therapeutic listening and emotional support provided. I have updated nursing staff no new changes to current goals or plan of care as he continues to remain a DNR.  11 / 11 / 2019 weight 142.3 lbs 1 / 7 / 2020 weight 149.5 lbs  RECOMMENDATIONS and PLAN:   1. Palliative care encounter Z51.5; Palliative medicine team will continue to support patient, patient's family, and medical team. Visit consisted of counseling and education dealing with the complex and emotionally intense issues of symptom management and palliative care in the setting of serious and potentially life-threatening illness  2. Anorexia R63.0 secondary to protein calorie malnutrition improving reflective of weight gain. Continue to monitor daily weights, supplements, supportive measures and courage to eat  3. Memory loss R41.3 appears progressive. Medical goals to continue to focus on Comfort, redirecting with supportive measures.  I spent 45 minutes providing  this consultation,  from 12:00pm to 12:45pm. More than 50% of the time in this consultation was spent coordinating communication.   HISTORY OF PRESENT ILLNESS:  Frank Owens is a 85 y.o. year old male with multiple medical problems including CLL, hypertension, diabetes. He was hospitalized 10 / 20 / 2018 to 10 / 24 / 2018 for bradycardia unresolved with transcutaneous pacemaker which was discontinued off in the emergency department and no further forms of aggressive intervention to place a permanent pacemaker was completed. Frank Owens continues to reside at skilled [place]. Frank Owens does transfer to the wheelchair but requires assistance for adl's with intermittent episodes of incontinence. He is able to feed himself and appetite has varied for staff. Last palliative care visit 9/18 / 2019 appetite variable but did discuss possible surgery for cataract removal and quality of life. 1 / 3 / 2020 referral [facility] cataract surgery at [facility]. No recent Falls, wounds, infections, hospitalizations. 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 He is able to make his needs known. At present he is lying in bed, appears elderly, then but comfortable. No visitors present. . Palliative Care was asked to help address goals of care.   CODE STATUS: DNR  PPS: 40% HOSPICE ELIGIBILITY/DIAGNOSIS: TBD  PAST MEDICAL HISTORY:  Past Medical History:  Diagnosis Date  . CLL (chronic lymphocytic leukemia) ([place])   . Diabetes mellitus without complication ([place])   . Hypertension     SOCIAL HX:  Social History   Tobacco Use  . Smoking status: Never Smoker  . Smokeless tobacco: Never Used  Substance Use Topics  . Alcohol use: No    ALLERGIES: No Known Allergies  PERTINENT MEDICATIONS:  Outpatient Encounter Medications as of 01/19/2018  Medication Sig  . atorvastatin (LIPITOR) 20 MG tablet Take 20 mg by mouth daily.  . carvedilol (COREG) 3.125 MG tablet  Take 1 tablet (3.125 mg total) by mouth 2 (two) times daily.  . hydrALAZINE (APRESOLINE) 25 MG tablet Take 1 tablet (25 mg total) by mouth every 8 (eight) hours.  . polyethylene glycol (MIRALAX / GLYCOLAX) packet Take 17 g by mouth daily as needed for moderate constipation.   No facility-administered encounter medications on file as of 01/19/2018.     PHYSICAL EXAM:   General: NAD, frail appearing, thin elderly male Cardiovascular: regular rate and rhythm Pulmonary: clear ant fields Abdomen: soft, nontender, + bowel sounds GU: no suprapubic tenderness Extremities: no edema, no joint deformities Skin: no rashes Neurological: Weakness but otherwise nonfocal  Christin Ihor Gully, NP

## 2018-01-21 ENCOUNTER — Telehealth: Payer: Self-pay | Admitting: Nurse Practitioner

## 2018-01-21 NOTE — Telephone Encounter (Signed)
I called [name], HCPOA, message left to return call about PC visit

## 2018-01-25 ENCOUNTER — Telehealth: Payer: Self-pay | Admitting: Nurse Practitioner

## 2018-01-25 NOTE — Telephone Encounter (Signed)
  Frank Owens, Frank Owens niece Healthcare power of attorney return my call. We talked about purpose for palliative care visit and verbal consent obtained. We talked about symptoms, appetite, progression of chronic disease. We talked about upcoming Ophthalmology appointment and reviewed note for cataracts possible removal. We talked about quality of life versus quantity of days. We talked about medical goals of care with focus on Comfort. We talked about cataracts removal being low-risk and they improve his quality of life as to help him see the food on his plate better. We talked about natural Aging in the progression of chronic disease. At present time Frank Owens does remain stable. We talked about role of palliative care and plan of care. DNR remains in place. Discuss will follow up in 3 months if needed or sooner should he declined. Frank Owens in agreement. Therapeutic listening and emotional support provided. Questions answer to satisfaction. Contact information.  Total time spent 30 minutes  Documentation 10 minutes  Phone discussion 20 minutes

## 2018-01-25 NOTE — Telephone Encounter (Signed)
Returned call, [name] left a message this am. Message left to return call

## 2018-04-21 ENCOUNTER — Non-Acute Institutional Stay: Payer: Medicare Other | Admitting: Nurse Practitioner

## 2018-04-21 ENCOUNTER — Other Ambulatory Visit: Payer: Self-pay

## 2018-04-21 VITALS — BP 157/80 | HR 74 | Temp 98.3°F | Resp 18 | Wt 137.0 lb

## 2018-04-21 DIAGNOSIS — R413 Other amnesia: Secondary | ICD-10-CM

## 2018-04-21 DIAGNOSIS — Z515 Encounter for palliative care: Secondary | ICD-10-CM

## 2018-04-21 DIAGNOSIS — R630 Anorexia: Secondary | ICD-10-CM

## 2018-04-21 NOTE — Progress Notes (Signed)
Frank Owens Consult Note Telephone: [phone]  Fax: [phone]  PATIENT NAME: Frank Owens DOB: 28-Jul-1919 MRN: [id number]  PRIMARY CARE PROVIDER:   Dr Margarita Rana PROVIDER:  Dr [facility] RESPONSIBLE PARTY:  Frank Owens niece [place] [phone]  RECOMMENDATIONS and PLAN:   1. Palliative care encounter Z51.5; Palliative medicine team will continue to support patient, patient's family, and medical team. Visit consisted of counseling and education dealing with the complex and emotionally intense issues of symptom management and palliative care in the setting of serious and potentially life-threatening illness  2. Anorexia R63.0 secondary to protein calorie malnutrition improving reflective of weight gain. Continue to monitor daily weights, supplements, supportive measures and courage to eat  3. Memory loss R41.3 appears progressive. Medical goals to continue to focus on Comfort, redirecting with supportive measures.  ASSESSMENT:     I visited and observed Frank Owens. We talked about purpose for palliative care visit. We talked about how he's feeling today. He verbalize that he is overall tired. He didn't sleep well last night. There's nothing specific that's bothering him. He was trying to get a nap in. We talked about sleep hygiene though limited with cognitive mild impairment. We talked about symptoms of pain and shortness of breath which he denies. We talked about his appetite. He talked about what he had to eat for breakfast today. We talked about residing in skilled facility. He talked about challenges with having to stay in his room secondary to covid-19 epidemic crisis. He shared that it's difficult having visitors. He was cooperative with assessment. Emotional support provided. He does currently appear to be stable. I attempted to contact Frank Owens, his niece Healthcare power of attorney.  No new changes recommended to current plan of care. I have updated nursing staff.  11 / 11 / 2019 weight 142.3 lbs 1 / 7 / 2020 weight 149.5 lbs 4 / 5 / 2020 Weight 137 lbs  I spent 45 minutes providing this consultation,  from 12:45pm to 1:30pm. More than 50% of the time in this consultation was spent coordinating communication.   HISTORY OF PRESENT ILLNESS:  Frank Owens is a 85 y.o. year old male with multiple medical problems including CLL, hypertension, diabetes, memory deficits. Frank Owens continues to reside at skilled [place]. He does transfer to the wheelchair though at times week. He does require assistance with ADLs. He has episodes of urinary incontinence. He's able to feed himself with the fair appetite depending on what's being served. [facility] no diabetic retinopathy saw on 3/5 / 2020.No recent Falls, wounds, infections, hospitalizations. Medical goals to focus on comfort. He is able to verbalize his needs. Staff endorses he has been doing about the same. At present Frank Owens is lying in bed. He appears tired. No visitors present. Palliative Care was asked to help address goals of care.   CODE STATUS: DNR  PPS: 40% HOSPICE ELIGIBILITY/DIAGNOSIS: TBD  PAST MEDICAL HISTORY:  Past Medical History:  Diagnosis Date  . CLL (chronic lymphocytic leukemia) ([place])   . Diabetes mellitus without complication ([place])   . Hypertension     SOCIAL HX:  Social History   Tobacco Use  . Smoking status: Never Smoker  . Smokeless tobacco: Never Used  Substance Use Topics  . Alcohol use: No    ALLERGIES: No Known Allergies   PERTINENT MEDICATIONS:  Outpatient Encounter Medications as of 04/21/2018  Medication Sig  .  atorvastatin (LIPITOR) 20 MG tablet Take 20 mg by mouth daily.  . carvedilol (COREG) 3.125 MG tablet Take 1 tablet (3.125 mg total) by mouth 2 (two) times daily.  . hydrALAZINE (APRESOLINE) 25 MG tablet Take 1 tablet (25 mg total) by mouth  every 8 (eight) hours.  . polyethylene glycol (MIRALAX / GLYCOLAX) packet Take 17 g by mouth daily as needed for moderate constipation.   No facility-administered encounter medications on file as of 04/21/2018.     PHYSICAL EXAM:   General: NAD, frail appearing, thin elderly male Cardiovascular: regular rate and rhythm Pulmonary: clear ant fields Abdomen: soft, nontender, + bowel sounds GU: no suprapubic tenderness Extremities: no edema, no joint deformities Skin: no rashes Neurological: Weakness but otherwise nonfocal  Frank Owens Frank Gully, NP

## 2018-05-25 ENCOUNTER — Non-Acute Institutional Stay: Payer: Medicare Other | Admitting: Nurse Practitioner

## 2018-05-25 ENCOUNTER — Encounter: Payer: Self-pay | Admitting: Nurse Practitioner

## 2018-05-25 VITALS — BP 128/72 | HR 62 | Temp 98.4°F | Resp 18 | Ht 68.0 in | Wt 137.6 lb

## 2018-05-25 DIAGNOSIS — R630 Anorexia: Secondary | ICD-10-CM

## 2018-05-25 DIAGNOSIS — R413 Other amnesia: Secondary | ICD-10-CM | POA: Insufficient documentation

## 2018-05-25 DIAGNOSIS — Z515 Encounter for palliative care: Secondary | ICD-10-CM

## 2018-05-25 NOTE — Progress Notes (Signed)
[place] Consult Note Telephone: [phone]  Fax: [phone]  PATIENT NAME: Frank Owens DOB: 10-24-1919 MRN: [id number]  PRIMARY CARE PROVIDER:  Dr Margarita Rana PROVIDER: Dr [facility] RESPONSIBLE PARTY:   [name] niece [place] [phone]  RECOMMENDATIONS and PLAN:   1. Palliative care encounter Z51.5; Palliative medicine team will continue to support patient, patient's family, and medical team. Visit consisted of counseling and education dealing with the complex and emotionally intense issues of symptom management and palliative care in the setting of serious and potentially life-threatening illness  Medical goals of care to include DNR, no feeding tube but wishes are for antibiotics, blood transfusion, diagnostic testing, lab testing, hospitalization, intubation  2. Anorexia R63.0 secondary to protein calorie malnutrition improving reflective of weight gain. Continue to monitor daily weights, supplements, supportive measures and courage to eat  3. Memory loss R41.3 appears progressive. Medical goals to continue to focus on Comfort, redirecting with supportive measures.  ASSESSMENT:     I visited and observed Mr. Frank Owens. We talked about purpose of palliative care visit and he verbally agreed. He shared he was thankful for the visit. He was hard of hearing today. We talked about symptoms of pain and shortness of breath but she denies. We talked about the importance of mobility and getting out of bed. He verbalize that it is cold and rainy today it is time for a nap. We talked about his appetite. He sure that he's eating just fine. He was cooperative with the assessment. We talked about residing at the facility and barriers with recent no visitors do to covid-19 crisis pandemic. We talked about coping strategies. We talked about role of palliative care and plan of care. He talked  about his family. Therapeutic listening and emotional support provided. I have attempted to call Frank Owens for update on palliative care visit his niece. The new changes to current goals are Care at present time. Will continue with focus on comfort with DNR in place.  3 / 26 / 2020 sodium 139, potassium 4.2, chloride 104, Co2 27, calcium 8.5, bun 23.7, creatinine 0.96, glucose 89, phosphorus 3.0, vitamin D 14.31  11 / 11 / 2019 weight 142.3 lbs 1 / 7 / 2020 weight 149.5 lbs 4 / 27 / 2020 weight 137.6 lb BMI 20.9  I spent 45 minutes providing this consultation,  from 9:45am to 10:15am. More than 50% of the time in this consultation was spent coordinating communication.   HISTORY OF PRESENT ILLNESS:  Frank Owens is a 85 y.o. year old male with multiple medical problems including CLL, hypertension, diabetes.Mr. Gutman continues to reside at skilled [place] at [facility]. He is a long-term care resident. He does transfer to the wheelchair where he is able to sit up on his own. He is able to sit up on his own on the side of the bed. He does require assistance with ADLs including prompting. He does have episodes of urinary incontinence. He is able to feed himself after tray setup with Fair appetite. Staff reports no new changes. No recent hospitalizations, wounds, infections, Falls. DNR does remain in place with medical goals to focus on comfort. He does wear TED hose. He is followed by Podiatry at the facility. Last primary provider visit 4 / 20 / 2020 for follow-up chronic kidney disease stage 3 with Improvement and no uremia noted. At present he is lying in bed. He  appears comfortable. No visitors present. Palliative Care was asked to help to continue to address goals of care.   CODE STATUS: DNR  PPS: 40% HOSPICE ELIGIBILITY/DIAGNOSIS: TBD  PAST MEDICAL HISTORY:  Past Medical History:  Diagnosis Date   CLL (chronic lymphocytic leukemia) ([place])    Diabetes  mellitus without complication ([place])    Hypertension     SOCIAL HX:  Social History   Tobacco Use   Smoking status: Never Smoker   Smokeless tobacco: Never Used  Substance Use Topics   Alcohol use: No    ALLERGIES: No Known Allergies   PERTINENT MEDICATIONS:  Outpatient Encounter Medications as of 05/25/2018  Medication Sig   carvedilol (COREG) 12.5 MG tablet Take 12.5 mg by mouth 2 (two) times a day.   hydrALAZINE (APRESOLINE) 25 MG tablet Take 1 tablet (25 mg total) by mouth every 8 (eight) hours.   [DISCONTINUED] atorvastatin (LIPITOR) 20 MG tablet Take 20 mg by mouth daily.   [DISCONTINUED] carvedilol (COREG) 3.125 MG tablet Take 1 tablet (3.125 mg total) by mouth 2 (two) times daily.   [DISCONTINUED] polyethylene glycol (MIRALAX / GLYCOLAX) packet Take 17 g by mouth daily as needed for moderate constipation.   No facility-administered encounter medications on file as of 05/25/2018.     PHYSICAL EXAM:   General: NAD, frail appearing, pleasant elderly male Cardiovascular: regular rate and rhythm Pulmonary: clear ant fields Abdomen: soft, nontender, + bowel sounds GU: no suprapubic tenderness Extremities: no edema, no joint deformities Skin: no rashes Neurological: generalized weakness  Ajai Terhaar Ihor Gully, NP

## 2018-05-26 ENCOUNTER — Other Ambulatory Visit: Payer: Self-pay

## 2018-08-09 ENCOUNTER — Other Ambulatory Visit: Payer: Self-pay

## 2018-08-09 ENCOUNTER — Non-Acute Institutional Stay: Payer: Medicare Other | Admitting: Nurse Practitioner

## 2018-08-09 ENCOUNTER — Encounter: Payer: Self-pay | Admitting: Nurse Practitioner

## 2018-08-09 VITALS — BP 155/66 | HR 62 | Temp 98.2°F | Resp 20 | Ht 68.0 in | Wt 140.0 lb

## 2018-08-09 DIAGNOSIS — R413 Other amnesia: Secondary | ICD-10-CM

## 2018-08-09 DIAGNOSIS — Z515 Encounter for palliative care: Secondary | ICD-10-CM

## 2018-08-09 DIAGNOSIS — R630 Anorexia: Secondary | ICD-10-CM

## 2018-08-09 NOTE — Progress Notes (Signed)
[place] Consult Note Telephone: [phone]  Fax: [phone]  PATIENT NAME: Frank Owens DOB: [birth year] MRN: [id number]  PRIMARY CARE PROVIDER:  Dr Yves Dill PROVIDER:  Dr Hodges/[facility] RESPONSIBLE PARTY:   Frank Owens niece Frank Owens [phone]  RECOMMENDATIONS and PLAN:   1.Palliative care encounter Z51.5; Palliative medicine team will continue to support patient, patient's family, and medical team. Visit consisted of counseling and education dealing with the complex and emotionally intense issues of symptom management and palliative care in the setting of serious and potentially life-threatening illness  2.Anorexia R63.0 secondary to protein calorie malnutrition improving reflective of weight gain. Continue to monitor daily weights, supplements, supportive measures and courage to eat  3.Memory loss R41.3 appears progressive. Medical goals to continue to focus on Comfort, redirecting with supportive measures.   ASSESSMENT:     I visited and observed Frank. Frank Owens. We talked about purpose for palliative care visit. He was an agreement. We talked about how he is feeling. He verbalizes that he was tired. We talked about importance of getting out of bed, interacting and socializing. He verbalizes it is hard with having to stay in their rooms. We talked about coping strategies. We talked about the importance of mobility and being out of bed. We talked about symptoms of pain and shortness of breath which he denies. We talked about his appetite. He shared that it depends on what's being served. He was cooperative with assessment. He does continue to be stable at present time. Discuss the role of palliative care though limited with cognitive mild impairment. Emotional support provided.  I called Frank Owens, Frank Owens niece health care power-of-attorney. Who talked about purpose for  palliative care visit, clinical update discuss. We talked about palliative visit with Frank. Frank Owens. We talked about symptoms, appetite. We talked about challenges with covid- 19in, no visitor allowed at the facility at present time. We talked about challenges with decrease in socialization and limited mobility. Focus continues on [place] with DNR in place. We talked about role of palliative care and plan of care. Discuss that will follow up in 4 weeks if needed Focus continues on [place] with DNR in place. We talked about role of palliative care and plan of care. Discuss that will follow up in 4 weeks if needed or sooner should he declined. Frank Owens and agreement. Therapeutic listening and emotional support provided. Questions answered to satisfaction. Contact information provided. I have updated nursing staff know any changes to current goals or plan of care.  11 / 11 / 2019 weight 142.3 lbs 1 / 7 / 2020 weight 149.5 lbs 4 / 5 / 2020 Weight 137 lbs 06/16/2018 weight 139.9 lbs 08/08/2018 weight 140.0 lbs  I spent 45 minutes providing this consultation,  from 10:30am to 11:15am. More than 50% of the time in this consultation was spent coordinating communication.   HISTORY OF PRESENT ILLNESS:  Frank Owens is a 85 y.o. year old male with multiple medical problems including CLL, hypertension, diabetes, memory deficits .Frank Owens continues to reside at [place] long-term facility at [facility]. He does transfer tell wheelchair where he does sit. He has been more isolated in his room do to prove it. He does require assistance with ADLs. He has been having some episodes of urinary incontinence. He does feed himself after tray set up an appetite is good. Continues on a regular diet, regular texture, regular liquid consistency. He  has had eight stable weight. He is able to verbalize his needs though at times confused. No recent Falls, wounds, infections, hospitalizations. DNR does  remain in place. Last primary provider visit 6 / 23 / 83 for follow-up hypertensive heart and chronic kidney disease with heart failure. Blood pressure is controlled with medical therapy so has been slightly labile but with age. Heart failure with ongoing treatment with beta blocker. Chronic kidney disease GFR improved. Frank. Frank Owens a person is lying in bed. He verbalizes that he's tired. He appears elderly but comfortable. No visitors present. Palliative Care was asked to help to continue to address goals of care.   CODE STATUS: DNR  PPS: 30% HOSPICE ELIGIBILITY/DIAGNOSIS: TBD  PAST MEDICAL HISTORY:  Past Medical History:  Diagnosis Date   CLL (chronic lymphocytic leukemia) ([place])    Diabetes mellitus without complication ([place])    Hypertension     SOCIAL HX:  Social History   Tobacco Use   Smoking status: Never Smoker   Smokeless tobacco: Never Used  Substance Use Topics   Alcohol use: No    ALLERGIES: No Known Allergies   PERTINENT MEDICATIONS:  Outpatient Encounter Medications as of 08/09/2018  Medication Sig   bisacodyl (DULCOLAX) 5 MG EC tablet Take 10 mg by mouth daily as needed for moderate constipation.   carvedilol (COREG) 12.5 MG tablet Take 12.5 mg by mouth 2 (two) times a day.   hydrALAZINE (APRESOLINE) 25 MG tablet Take 1 tablet (25 mg total) by mouth every 8 (eight) hours.   No facility-administered encounter medications on file as of 08/09/2018.     PHYSICAL EXAM:   General: NAD, frail appearing, thin male Cardiovascular: regular rate and rhythm Pulmonary: clear ant fields Abdomen: soft, nontender, + bowel sounds Extremities: no edema, no joint deformities Neurological: Weakness but otherwise nonfocal  Danyale Ridinger Ihor Gully, NP

## 2018-09-09 ENCOUNTER — Encounter: Payer: Self-pay | Admitting: Nurse Practitioner

## 2018-09-09 ENCOUNTER — Non-Acute Institutional Stay: Payer: Medicare Other | Admitting: Nurse Practitioner

## 2018-09-09 VITALS — BP 135/86 | HR 72 | Temp 98.2°F | Resp 18 | Wt 140.7 lb

## 2018-09-09 DIAGNOSIS — Z515 Encounter for palliative care: Secondary | ICD-10-CM

## 2018-09-09 NOTE — Progress Notes (Signed)
[place] Consult Note Telephone: [phone]  Fax: [phone]  PATIENT NAME: Frank Owens DOB: March 25, 1919 MRN: [id number]  PRIMARY CARE PROVIDER:   Dr Yves Dill PROVIDER:  Dr Hodges/[facility] RESPONSIBLE PARTY:   [name] niece [place] [phone]  RECOMMENDATIONS and PLAN:   1.ACP: DNR, medical goals focus on no feeding tube, DNR but wishes are for antibiotics, blood transfusion, diagnostic testing, hospitalization, IV fluids, intubation, lab testing.  2.Anorexia; secondary to protein calorie malnutrition improving reflective of weight gain. Continue to monitor daily weights, supplements, supportive measures and courage to eat  3.Memory loss; appears progressive. Medical goals to continue to focus on Comfort, redirecting with supportive measures.  4. Palliative care encounter; Palliative medicine team will continue to support patient, patient's family, and medical team. Visit consisted of counseling and education dealing with the complex and emotionally intense issues of symptom management and palliative care in the setting of serious and potentially life-threatening illness  I spent 30 minutes providing this consultation,  from 12:00pm to 12:30pm. More than 50% of the time in this consultation was spent coordinating communication.   HISTORY OF PRESENT ILLNESS:  Frank Owens is a 85 y.o. year old male with multiple medical problems including  CLL, hypertension, diabetes, memory deficits. Frank Owens continues to reside in [place] at [facility]. He does transferred to the wheelchair where he is able to sit, he is somewhat mobile in his wheelchair. He is ADL dependent. He does feed himself after assistance from [place] setup. Appetite has been Fair. Staff endorses that he does sleep more during the day. He did have a fall on 8  / 20 / 2020 with no noted injury. No recent hospitalizations, wounds, infections. He is a DNR. He is followed by Podiatry and Dental at the facility. Last primary provider note 08/23/2018 for diabetes with diabetic cataracts for which he is followed by in-house Ophthalmology. Hemoglobin A1c is at goal < 9. No symptoms of hypoglycemia. At present he is lying in bed. He appears comfortable, elderly. No visitors present. I visited and observe Frank Owens. We talked about purpose of palliative care visit and he repetitively asked how I was doing. He did make eye contact, was interactive engaging. He didn't answer no to questions about shortness of breath or pain. He shared that he was not hungry. He did talk about missing his family with no visitors allowed at present time do to covid-19 pandemic. We talked about coping strategies. He was cooperative with assessment. We talked about residing in skilled facility [facility] as he got tired throughout palliative care visit. Emotional support provided. I have attempted to contact his niece for update on palliative care visit. I updated nursing staff the new changes to current goals or plan of care. Focus remains on comfort with d and are in place. Will continue to follow him on through Palliative care, wait for decline with next visit in 4 weeks or sooner should he declined.  8 / 19 / 2020 covid-19 test negative  6 / 4 / 2020 weight 139.9 lbs 7 / 9 / 2020 weight 140.6 lbs 8 / 6 / 2020 weight 140.5 lbs 8 / 24 / 2020 weight 140.7 lbs BMI 21.4  Palliative Care was asked to help to continue to address goals of care.   CODE STATUS: DNR  PPS: 40% HOSPICE ELIGIBILITY/DIAGNOSIS: TBD  PAST MEDICAL HISTORY:  Past Medical  History:  Diagnosis Date  . CLL (chronic lymphocytic leukemia) ([place])   . Diabetes mellitus without complication ([place])   . Hypertension     SOCIAL HX:  Social History   Tobacco Use  . Smoking status: Never Smoker  . Smokeless tobacco: Never  Used  Substance Use Topics  . Alcohol use: No    ALLERGIES: No Known Allergies   PERTINENT MEDICATIONS:  Outpatient Encounter Medications as of 09/09/2018  Medication Sig  . bisacodyl (DULCOLAX) 5 MG EC tablet Take 10 mg by mouth daily as needed for moderate constipation.  . carvedilol (COREG) 12.5 MG tablet Take 12.5 mg by mouth 2 (two) times a day.  . hydrALAZINE (APRESOLINE) 25 MG tablet Take 1 tablet (25 mg total) by mouth every 8 (eight) hours.   No facility-administered encounter medications on file as of 09/09/2018.     PHYSICAL EXAM:   General: NAD, frail appearing, thin, pleasant male Cardiovascular: regular rate and rhythm Pulmonary: clear ant fields Abdomen: soft, nontender, + bowel sounds Extremities: no edema, no joint deformities Neurological: Weakness but otherwise nonfocal   Ihor Gully, NP

## 2018-09-12 ENCOUNTER — Other Ambulatory Visit: Payer: Self-pay

## 2018-10-29 IMAGING — CT CT ANGIO CHEST
2 of 6 series · 18 of 36 positions shown · IV contrast (Omni 300)
Comparison: None.

CLINICAL DATA: PE suspected, high pretest probability.
Hypertension, diabetes, CLL, bradycardia.

EXAM:
CT ANGIOGRAPHY CHEST WITH CONTRAST
TECHNIQUE: Multidetector CT imaging of the chest was performed using the
standard protocol during bolus administration of intravenous
contrast. Multiplanar CT image reconstructions and MIPs were
obtained to evaluate the vascular anatomy.
CONTRAST:  100 cc Isovue 370

[Series 7: pe thins · axial · 0.74mm/px · z∈[+1108,+1354]mm · 17 of 278 slices shown]
[im 16/278  lung]
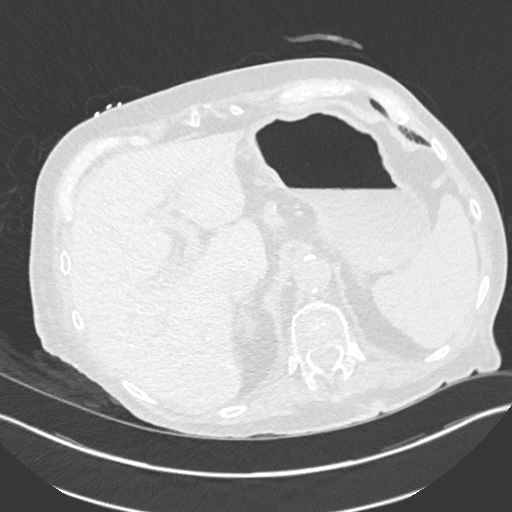
[im 31/278  mediastinal]
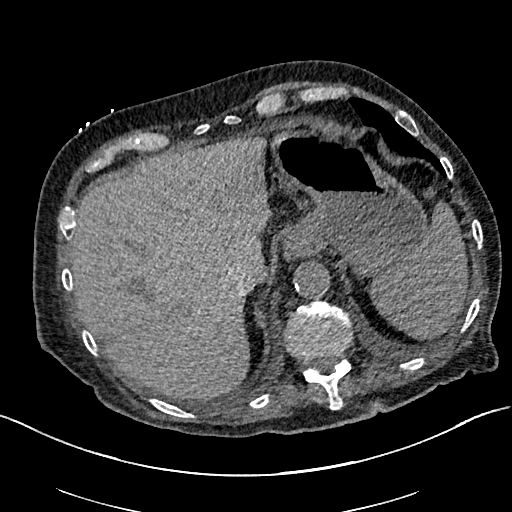
[im 47/278  lung]
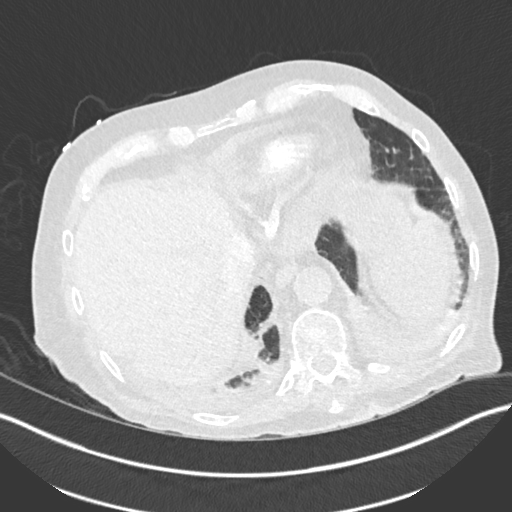
[im 62/278  mediastinal]
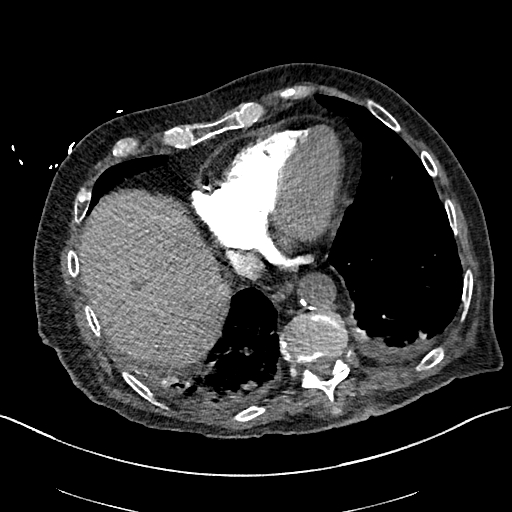
[im 77/278  lung]
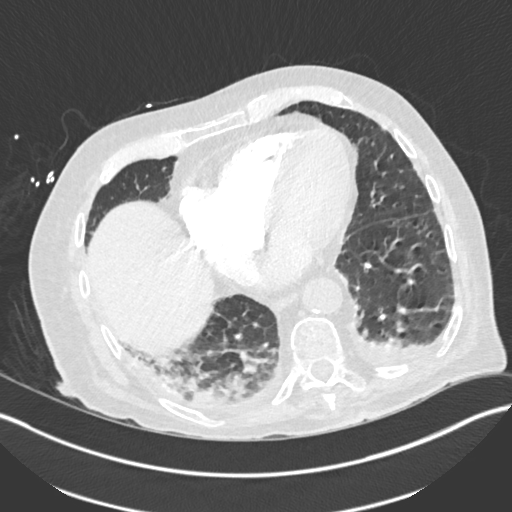
[im 93/278  mediastinal]
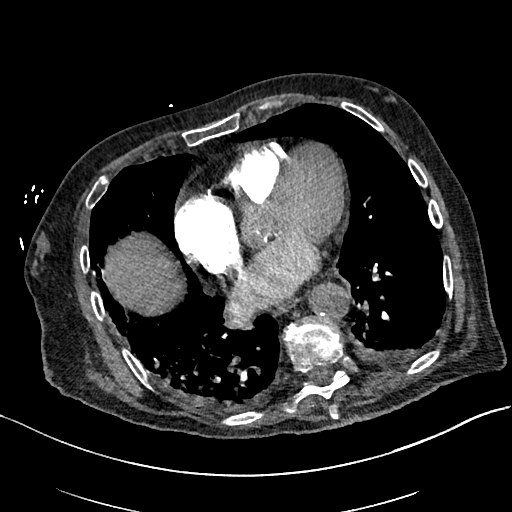
[im 108/278  lung]
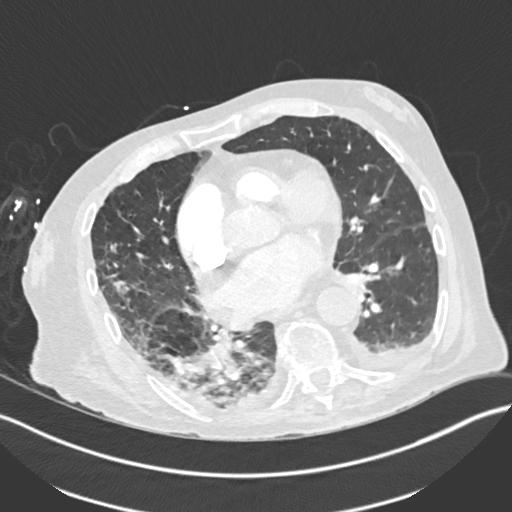
[im 124/278  mediastinal]
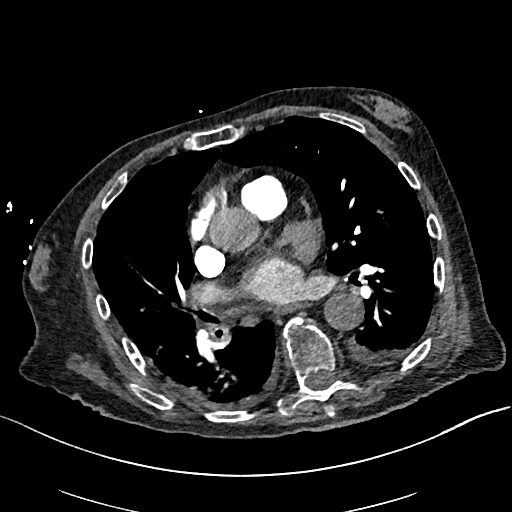
[im 139/278  lung]
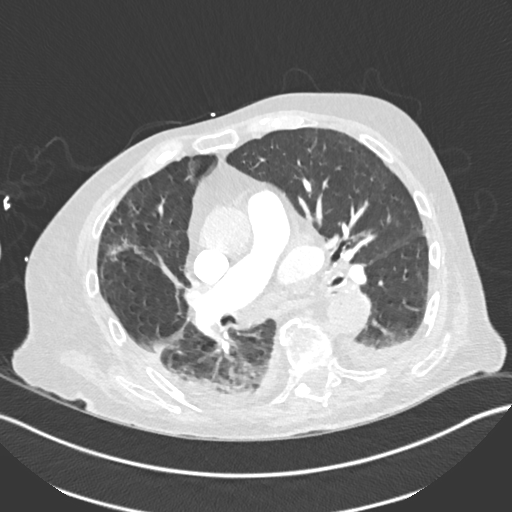
[im 154/278  mediastinal]
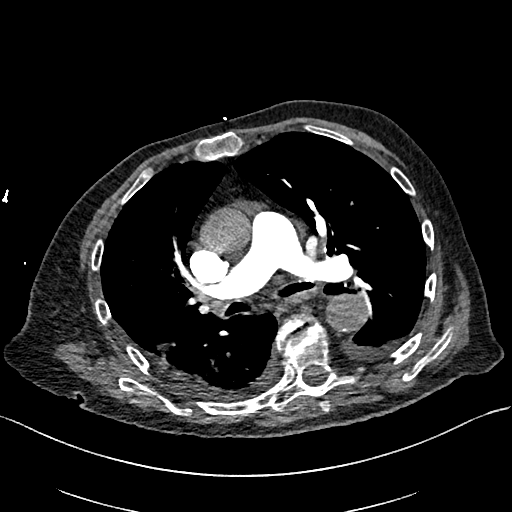
[im 170/278  lung]
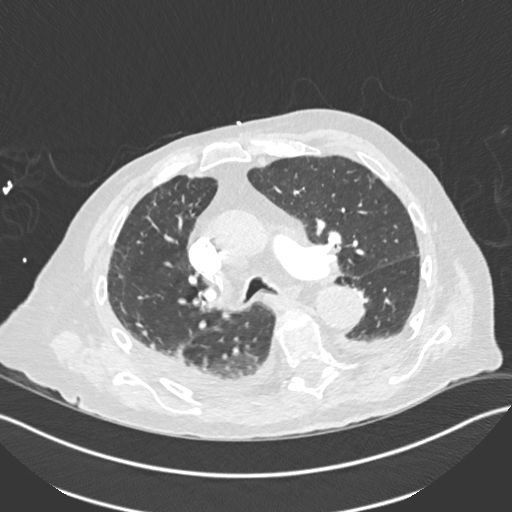
[im 185/278  mediastinal]
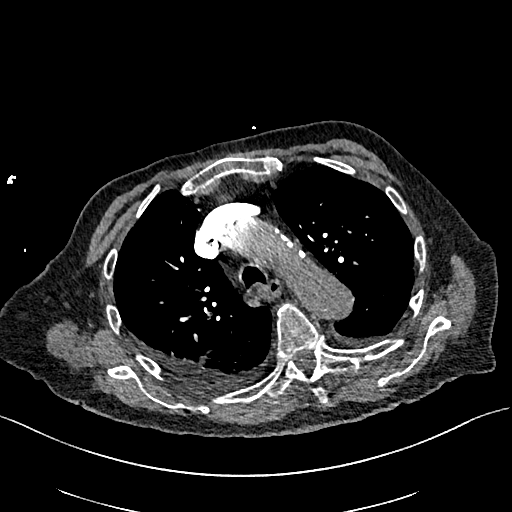
[im 201/278  lung]
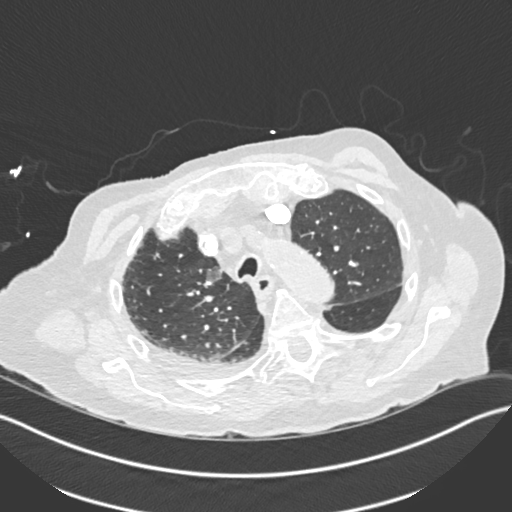
[im 216/278  mediastinal]
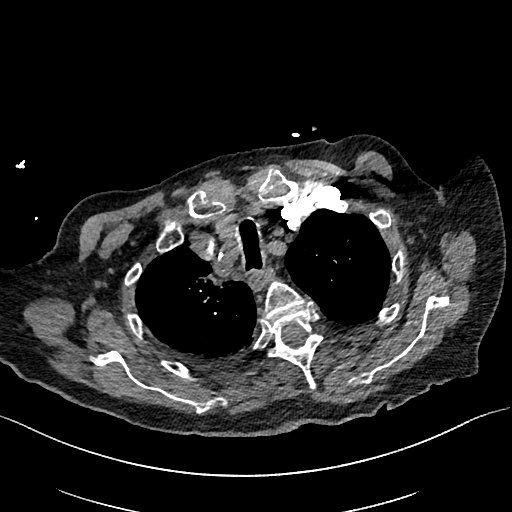
[im 231/278  lung]
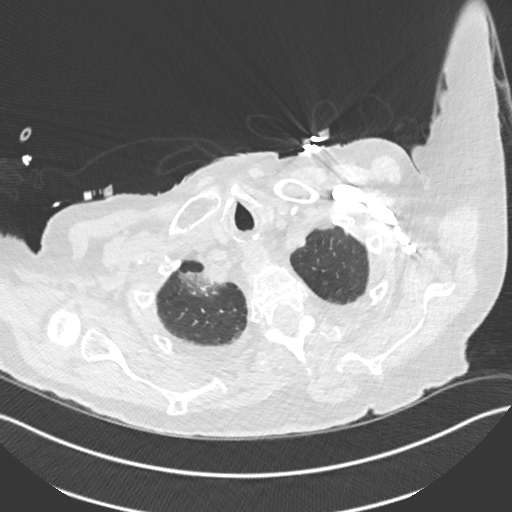
[im 247/278  mediastinal]
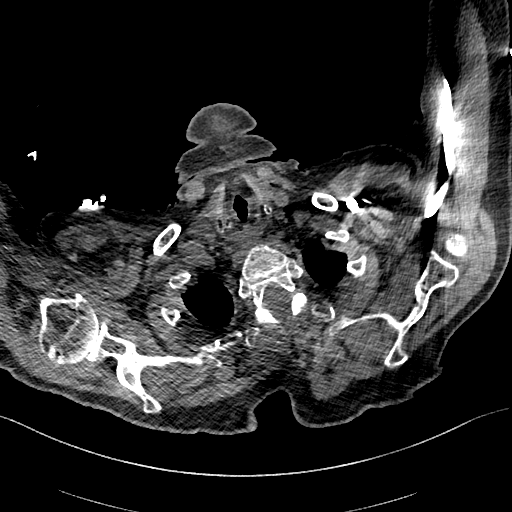
[im 262/278  lung]
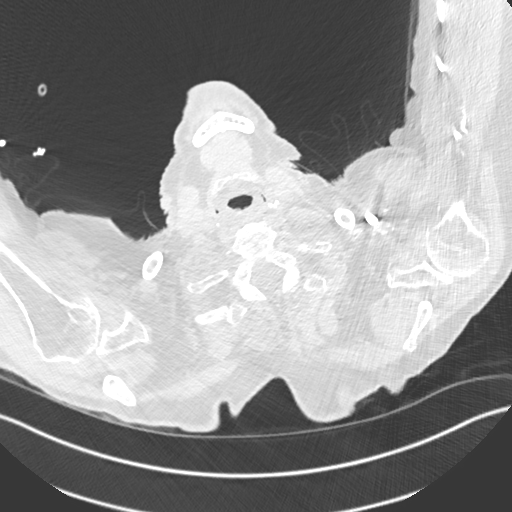

[Series 8: pe 2mm cor · coronal · 0.57mm/px · 1 of 143 slices shown]
[im 72/143  mediastinal]
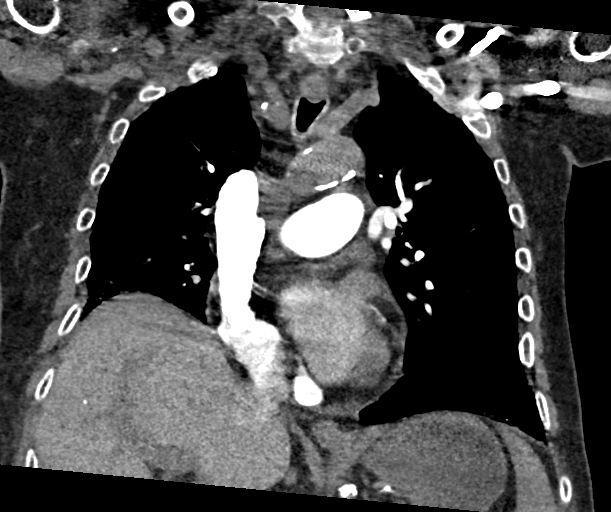

[18 of 36 positions shown; findings below may reference images not displayed]

FINDINGS: Cardiovascular: The peripheral segmental and subsegmental pulmonary
artery branches to the lower lobes cannot be characterized due to
extensive patient breathing motion artifact. There is no pulmonary
embolism identified within the main, central lobar or upper lobe
segmental pulmonary artery branches bilaterally.

Heart size is upper normal. No pericardial effusion. Coronary artery
calcifications noted. Aortic atherosclerosis. No aortic aneurysm.

Mediastinum/Nodes: Esophagus is unremarkable. No mass or enlarged
lymph nodes within the mediastinum or perihilar regions.

Lungs/Pleura: Patchy bibasilar consolidations, pneumonia versus
atelectasis. Additional focal dense consolidation within the right
lung apex, with associated ground-glass opacity. Small bilateral
pleural effusions.

Upper Abdomen: No acute findings.

Musculoskeletal: Degenerative changes of the kyphotic thoracolumbar
spine, mild to moderate in degree. No acute or suspicious osseous
finding.

Review of the MIP images confirms the above findings.
IMPRESSION: 1. Study is significantly limited by patient breathing motion
artifact. The segmental and subsegmental pulmonary artery branch to
the lower lobes cannot be characterized due to this extensive motion
artifact. There is no central obstructing pulmonary embolism within
the main, lobar or upper lobe segmental pulmonary artery branches.
2. Patchy bibasilar consolidations, pneumonia versus atelectasis.
Small adjacent pleural effusions.
3. Additional small nodular consolidation at the right lung apex
with surrounding ground-glass opacity, of uncertain etiology, favor
chronic/benign. Consider follow-up chest CT 3-6 months to ensure
stability or resolution.
4. Heart size is upper normal.  Coronary artery calcifications.
5. Aortic atherosclerosis.  No aortic aneurysm.

## 2018-11-16 ENCOUNTER — Non-Acute Institutional Stay: Payer: Medicare Other | Admitting: Nurse Practitioner

## 2018-11-16 ENCOUNTER — Encounter: Payer: Self-pay | Admitting: Nurse Practitioner

## 2018-11-16 VITALS — BP 128/64 | HR 78 | Temp 97.5°F | Resp 20 | Wt 140.7 lb

## 2018-11-16 DIAGNOSIS — Z515 Encounter for palliative care: Secondary | ICD-10-CM

## 2018-11-16 NOTE — Progress Notes (Signed)
[place] [facility] Consult Note Telephone: [phone]  Fax: [phone]  PATIENT NAME: Frank Owens DOB: [birth year] MRN: [id number] PRIMARY CARE PROVIDER:   Dr Yves Dill PROVIDER:  Dr Hodges/[facility] RESPONSIBLE PARTY:   Frank Owens niece [place] [phone]  RECOMMENDATIONS and PLAN:  1.ACP: DNR, medical goals focus on no feeding tube, DNR but wishes are for antibiotics, blood transfusion, diagnostic testing, hospitalization, IV fluids, intubation, lab testing.  2.Anorexia; secondary to protein calorie malnutrition improving reflective of weight gain. Continue to monitor daily weights, supplements, supportive measures and courage to eat  3.Memory loss; appears progressive. Medical goals to continue to focus on Comfort, redirecting with supportive measures.  4. Palliative care encounter; Palliative medicine team will continue to support patient, patient's family, and medical team. Visit consisted of counseling and education dealing with the complex and emotionally intense issues of symptom management and palliative care in the setting of serious and potentially life-threatening illness  I spent 33 minutes providing this consultation,  from 10:30am to 11:03am. More than 50% of the time in this consultation was spent coordinating communication.   HISTORY OF PRESENT ILLNESS:  Frank Owens is a 85 y.o. year old male with multiple medical problems including CLL, hypertension, diabetes, memory deficits. Mr. Taimur continues to reside in [place] at [facility]. He is able to transfer to a wheelchair and sit up. He does require assistance with ADLs and toileting. He does feed himself after tray setup. Appetite continues to be fair to poor depending on what is being served. No recent Falls, wounds. Mr. Maize was covid positive but now is testing  negative. Staff endorses Mr. Lamarquis has been sleeping more. At present Mr. Karan is lying in bed. He appears comfortable. No visitors present. I visited and observe Mr. Lewellyn. I explained purpose of palliative care visit and he was in agreement. He is oriented to self at times he is able to answer more questions. He currently appears comfortable but pale. He was cooperative with assessment. I asked if he was having symptoms of pain, cough, shortness of breath which he denies. I attempted to talk about what he had for breakfast to eat but he does not remember. He did talk about wanting to be out of bed and tired of being in his room. Limited verbal discussion with cognitive impairment. Emotional support provided. I have attempted to call [name], his niece for update on palliative care visit. At present time he does remain stable. I updated nursing staff noted changes to current goals or plan of care has he remains a DNR with goals to focus on comfort. I called Frank Owens, Mr Burak's niece Healthcare power of attorney, no answer, message left to return call with contact information  Palliative Care was asked to help to continue to address goals of care.   CODE STATUS: DNR  PPS: 40% HOSPICE ELIGIBILITY/DIAGNOSIS: TBD  PAST MEDICAL HISTORY:  Past Medical History:  Diagnosis Date  . CLL (chronic lymphocytic leukemia) ([place])   . Diabetes mellitus without complication ([place])   . Hypertension     SOCIAL HX:  Social History   Tobacco Use  . Smoking status: Never Smoker  . Smokeless tobacco: Never Used  Substance Use Topics  . Alcohol use: No    ALLERGIES: No Known Allergies   PERTINENT MEDICATIONS:  Outpatient Encounter Medications as of 11/16/2018  Medication Sig  . bisacodyl (DULCOLAX) 5 MG EC  tablet Take 10 mg by mouth daily as needed for moderate constipation.  . carvedilol (COREG) 12.5 MG tablet Take 12.5 mg by mouth 2 (two) times a day.  . hydrALAZINE (APRESOLINE) 25 MG tablet Take 1  tablet (25 mg total) by mouth every 8 (eight) hours.   No facility-administered encounter medications on file as of 11/16/2018.     PHYSICAL EXAM:   General: NAD, frail appearing, thin elderly confusedc Cardiovascular: regular rate and rhythm Pulmonary: clear ant fields Abdomen: soft, nontender, + bowel sounds Extremities: no edema, no joint deformities Neurological: generalized weakness  Christin Ihor Gully, NP

## 2018-11-17 ENCOUNTER — Other Ambulatory Visit: Payer: Self-pay

## 2018-12-21 ENCOUNTER — Non-Acute Institutional Stay: Payer: Medicare Other | Admitting: Nurse Practitioner

## 2018-12-21 ENCOUNTER — Encounter: Payer: Self-pay | Admitting: Nurse Practitioner

## 2018-12-21 VITALS — BP 165/84 | HR 63 | Temp 97.5°F | Resp 18 | Wt 139.5 lb

## 2018-12-21 DIAGNOSIS — Z515 Encounter for palliative care: Secondary | ICD-10-CM

## 2018-12-21 DIAGNOSIS — C9110 Chronic lymphocytic leukemia of B-cell type not having achieved remission: Secondary | ICD-10-CM

## 2018-12-21 NOTE — Progress Notes (Signed)
[place] Consult Note Telephone: [phone]  Fax: [phone]  PATIENT NAME: Frank Owens DOB: [birth year] MRN: [id number] PRIMARY CARE PROVIDER:Dr Yves Dill PROVIDER:Dr Hodges/[place] [phone]  RECOMMENDATIONS and PLAN: 1.ACP: DNR, medical goals focus onno feeding tube, DNR, do not hospitalize,   wishes are for antibiotics, blood transfusion, diagnostic testing, IV fluids, intubation, lab testing.  2.Anorexia;secondary to protein calorie malnutrition improving reflective of weight gain. Continue to monitor daily weights, supplements, supportive measures and courage to eat  3.Memory loss;appears progressive. Medical goals to continue to focus on Comfort, redirecting with supportive measures.  4.Palliative care encounter; Palliative medicine team will continue to support patient, patient's family, and medical team. Visit consisted of counseling and education dealing with the complex and emotionally intense issues of symptom management and palliative care in the setting of serious and potentially life-threatening illness  I spent 35 minutes providing this consultation,  from 1:15pm to 1:50pm. More than 50% of the time in this consultation was spent coordinating communication.   HISTORY OF PRESENT ILLNESS:  Frank Owens is a 85 y.o. year old male with multiple medical problems including CLL, hypertension, diabetes, memory deficits. Mr. Frank Owens continues to reside at [place] at [facility]. Mr. Frank Owens does require staff assistance for transfers and mobility. Mr. Frank Owens is able to sit up in a wheelchair. Mr. Frank Owens is ADL dependent and episodes of incontinence. Mr. Frank Owens does feed himself after tray setup but has now requiring more prompting and encouraging. Appetite remains declined.  No significant weight loss. No recent hospitalizations, falls, wounds. Last primary provider visit for 12/06/2018 for hypertensive heart disease, chronic kidney disease for monthly visit and positive covid. Mr. Frank Owens does remain asymptomatic. At present Mr. Frank Owens is lying in bed. Mr. Frank Owens appears comfortable, elderly. No visitors present. I visited and observe Mr Frank Owens. Mr. Frank Owens did a wake to verbal cues. Mr. Frank Owens did make eye contact and answer questions when asked if he was in pain or short of breath. Mr. Frank Owens replied no. When asked if he was hungry, Mr. Frank Owens replied no. Attempted to get Mr. Frank Owens out of bed to sit in a wheelchair and he politely declined. Mr. Frank Owens said he was tired and wanting to go back to sleep. Mr. Frank Owens was cooperative with assessment. Emotional support provided. I have attempted to call his niece Frank Owens for update on palliative care visit. Mr. Frank Owens had a one pound weight loss in last month. Will continue to monitor and follow with palliative care was next visit in one month or sooner should he declined. I have updated nursing staff mood changes in current medical goals of care.  Palliative Care was asked to help to continue to address goals of care.   CODE STATUS: DNR  PPS: 40% HOSPICE ELIGIBILITY/DIAGNOSIS: TBD  PAST MEDICAL HISTORY:  Past Medical History:  Diagnosis Date  . CLL (chronic lymphocytic leukemia) ([place])   . Diabetes mellitus without complication ([place])   . Hypertension     SOCIAL HX:  Social History   Tobacco Use  . Smoking status: Never Smoker  . Smokeless tobacco: Never Used  Substance Use Topics  . Alcohol use: No    ALLERGIES: No Known Allergies   PERTINENT MEDICATIONS:  Outpatient Encounter Medications as of 12/21/2018  Medication Sig  . bisacodyl (DULCOLAX) 5 MG EC tablet Take 10 mg by mouth daily as needed for moderate constipation.  Marland Kitchen  carvedilol (COREG) 12.5 MG tablet Take 12.5 mg by mouth 2 (two) times a day.  . hydrALAZINE  (APRESOLINE) 25 MG tablet Take 1 tablet (25 mg total) by mouth every 8 (eight) hours.   No facility-administered encounter medications on file as of 12/21/2018.     PHYSICAL EXAM:   General: NAD, frail appearing, thin, elderly male Cardiovascular: regular rate and rhythm Pulmonary: clear ant fields Abdomen: soft, nontender, + bowel sounds Extremities: no edema, no joint deformities Neurological: generalized weakness  Frank Ihor Gully, NP

## 2018-12-22 ENCOUNTER — Other Ambulatory Visit: Payer: Self-pay

## 2019-02-01 ENCOUNTER — Encounter: Payer: Self-pay | Admitting: Nurse Practitioner

## 2019-02-01 ENCOUNTER — Other Ambulatory Visit: Payer: Self-pay

## 2019-02-01 ENCOUNTER — Non-Acute Institutional Stay: Payer: Medicare Other | Admitting: Nurse Practitioner

## 2019-02-01 VITALS — BP 164/77 | HR 61 | Temp 97.6°F | Resp 20 | Wt 139.5 lb

## 2019-02-01 DIAGNOSIS — C9110 Chronic lymphocytic leukemia of B-cell type not having achieved remission: Secondary | ICD-10-CM

## 2019-02-01 DIAGNOSIS — Z515 Encounter for palliative care: Secondary | ICD-10-CM

## 2019-02-01 NOTE — Progress Notes (Signed)
[facility] Consult Note Telephone: [phone]  Fax: [phone]  PATIENT NAME: Frank Owens DOB: 07-20-1919 MRN: [id number]  PRIMARY CARE PROVIDER:   Wenda Low, MD  REFERRING PROVIDER:  Wenda Low, MD [address] [place],  [place] [address]  PRIMARY CARE PROVIDER:Dr Yves Dill PROVIDER:Dr Hodges/[place] [phone]  RECOMMENDATIONS and PLAN: 1.ACP: DNR, medical goals focus onno feeding tube, DNR, do not hospitalize,   wishes are for antibiotics, blood transfusion, diagnostic testing, IV fluids, intubation, lab testing.  2.Anorexia;secondary to protein calorie malnutrition improving reflective of weight gain. Continue to monitor daily weights, supplements, supportive measures and courage to eat  3.Palliative care encounter; Palliative medicine team will continue to support patient, patient's family, and medical team. Visit consisted of counseling and education dealing with the complex and emotionally intense issues of symptom management and palliative care in the setting of serious and potentially life-threatening illness  I spent 35 minutes providing this consultation,  from 1:55pm to 2:30pm. More than 50% of the time in this consultation was spent coordinating communication.   HISTORY OF PRESENT ILLNESS:  Frank Owens is a 86 y.o. year old male with multiple medical problems including CLL, hypertension, diabetes, memory deficits. Frank Owens continues to reside at [place] at [facility]. Frank Owens does require staff assistance for transferring, adl's, mobility. Frank Owens does require assistance with toileting. Frank Owens feeds himself after try to set up an appetite continues to remain declined. Frank Owens is able to verbalize has needs, oriented to  self. No recent falls, wounds, infections, hospitalizations. Last primary provider visit 11/ 24 / 2020 for covid positive test and follow-up hypertension and chronic kidney disease. Medical goals focus on DNR, do not intubate, do not hospitalize, no feeding tube, no intubation but wishes are for antibiotics, blood transfusion, diagnostic testing, lab testing, IV fluids. Staff endorses Frank Owens is been doing well no new changes. At present Frank Owens is lying in bed. He does appear comfortable, feeding himself his lunch. No visitors present. I visited and observe Frank Owens. We talked about purpose of palliative care visit the limited verbal discussion with cognitive impairment. Frank Owens did make eye contact download interactives. Frank Owens was cooperative with assessment. Frank Owens did talk about his niece Frank Owens and no visiting due to covid pandemic. Frank Owens talked about  residing in skilled facility. Medical goals to focus on Comfort. Emotional support provided. I have attempted to contact Frank Owens, Frank Owens niece for update on palliative care visit. No new changes to current goals or plan of care. I have updated staff. No new changes to current goals or plan of care. Palliative Care was asked to help to continue to address goals of care.   CODE STATUS: DNR  PPS: 40% HOSPICE ELIGIBILITY/DIAGNOSIS: TBD  PAST MEDICAL HISTORY:  Past Medical History:  Diagnosis Date  . CLL (chronic lymphocytic leukemia) ([place])   . Diabetes mellitus without complication ([place])   . Hypertension     SOCIAL HX:  Social History   Tobacco Use  . Smoking status: Never Smoker  . Smokeless tobacco: Never Used  Substance Use Topics  . Alcohol use: No    ALLERGIES: No Known Allergies   PERTINENT MEDICATIONS:  Outpatient Encounter Medications as of 02/01/2019  Medication Sig  . acetaminophen (TYLENOL) 325 MG tablet Take 650 mg by mouth every 4 (four) hours as  needed.  . bisacodyl (DULCOLAX) 5 MG EC tablet Take  10 mg by mouth daily as needed for moderate constipation.  . carvedilol (COREG) 12.5 MG tablet Take 12.5 mg by mouth 2 (two) times a day.  . hydrALAZINE (APRESOLINE) 25 MG tablet Take 1 tablet (25 mg total) by mouth every 8 (eight) hours.   No facility-administered encounter medications on file as of 02/01/2019.    PHYSICAL EXAM:   General: NAD, frail appearing, thin, elderly, oriented to self male Cardiovascular: regular rate and rhythm Pulmonary: clear ant fields Abdomen: soft, nontender, + bowel sounds Extremities: no edema, no joint deformities Neurological: w/c dependent, generalized weakness  Frank Owens Frank Gully, NP

## 2019-02-07 ENCOUNTER — Telehealth: Payer: Self-pay | Admitting: Nurse Practitioner

## 2019-02-07 NOTE — Telephone Encounter (Signed)
[name], Frank Owens niece, Frank Owens return call. We talked about purpose or palliative care visit, visit with Frank Owens. We talked about challenges and barriers with covid-19 pandemic social isolation. We talked about overall flow declined, his age and comorbidity. We talked about weights and appetite. We talked about social isolation. We talked about symptoms. We talked about medical goals of care with focus on comfort. We talked about role of palliative care and plan of care. Discuss will follow monitor close with next visit in one month or so nor should he declined. Frank Owens in agreement. Therapeutic listening and emotional support provided. Contact information. Questions answered to satisfaction.  Total time spent 30 minutes  Phone discussion 20 minutes  Documentation 10 minutes

## 2019-02-22 ENCOUNTER — Other Ambulatory Visit: Payer: Self-pay

## 2019-02-22 ENCOUNTER — Encounter: Payer: Self-pay | Admitting: Nurse Practitioner

## 2019-02-22 ENCOUNTER — Non-Acute Institutional Stay: Payer: Medicare Other | Admitting: Nurse Practitioner

## 2019-02-22 VITALS — BP 110/68 | HR 72 | Temp 97.0°F | Resp 18 | Wt 139.5 lb

## 2019-02-22 DIAGNOSIS — C9110 Chronic lymphocytic leukemia of B-cell type not having achieved remission: Secondary | ICD-10-CM

## 2019-02-22 DIAGNOSIS — Z515 Encounter for palliative care: Secondary | ICD-10-CM

## 2019-02-22 NOTE — Progress Notes (Addendum)
[facility] Consult Note Telephone: [phone]  Fax: [phone]  PATIENT NAME: Frank Owens DOB: 05/03/1919 MRN: [id number]  PRIMARY CARE PROVIDER:Dr Yves Dill PROVIDER:Dr Hodges/[place] [phone]  RECOMMENDATIONS and PLAN: 1.ACP: DNR, medical goals focus onno feeding tube, DNR, do not hospitalize,  wishes are for antibiotics, blood transfusion, diagnostic testing, IV fluids, intubation, lab testing.  2.Anorexia;secondary to protein calorie malnutrition improving reflective of weight gain. Continue to monitor daily weights, supplements, supportive measures and courage to eat  3.Palliative care encounter; Palliative medicine team will continue to support patient, patient's family, and medical team. Visit consisted of counseling and education dealing with the complex and emotionally intense issues of symptom management and palliative care in the setting of serious and potentially life-threatening illness  I spent 45 minutes providing this consultation,  from 11:45am to 12:30pm. More than 50% of the time in this consultation was spent coordinating communication.   HISTORY OF PRESENT ILLNESS:  Frank Owens is a 86 y.o. year old male with multiple medical problems including CLL, hypertension, diabetes, memory deficits. Frank Owens continues to reside in [place] at [facility]. He is slowly declining for staff. Frank Owens now requiring staff assistance for transferring, total adl's, toileting with episodes of incontinence. Frank Owens does require assistance with feeding with appetite continuing to be poor. Frank Owens per staff is becoming more confused. Recent Labs repeated 2 / 8 / 2021 WBC 96.1, hemoglobin 10.9, hematocrit 34.4, platelets 145 in the setting of CLL. No recent falls,  wounds, hospitalizations. Medical goals are focusing on Comfort. At present Frank Owens is lying in bed. Frank Owens appears elderly, then and debilitated, weak. No visitors present. I visited and observed Frank Owens. Frank Owens is more confused today than he has been in the past. Frank Owens does make eye contact with verbal cues. Frank Owens was cooperative with assessment so not interactive, mumbling words. No meaningful discussion with cognitive impairment. Frank Owens appear to be over all declining. I have attempted to contact [name], these [place] of attorney for further discussion of medical goals and possible Hospice Services. Emotional support provided. I have dated nursing staff. Palliative Care was asked to help to continue to address goals of care.   CODE STATUS: DNR  PPS: 30% HOSPICE ELIGIBILITY/DIAGNOSIS: TBD  PAST MEDICAL HISTORY:  Past Medical History:  Diagnosis Date  . CLL (chronic lymphocytic leukemia) ([place])   . Diabetes mellitus without complication ([place])   . Hypertension     SOCIAL HX:  Social History   Tobacco Use  . Smoking status: Never Smoker  . Smokeless tobacco: Never Used  Substance Use Topics  . Alcohol use: No    ALLERGIES: No Known Allergies   PERTINENT MEDICATIONS:  Outpatient Encounter Medications as of 02/22/2019  Medication Sig  . acetaminophen (TYLENOL) 325 MG tablet Take 650 mg by mouth every 4 (four) hours as needed.  . bisacodyl (DULCOLAX) 5 MG EC tablet Take 10 mg by mouth daily as needed for moderate constipation.  . carvedilol (COREG) 12.5 MG tablet Take 12.5 mg by mouth 2 (two) times a day.  . hydrALAZINE (APRESOLINE) 25 MG tablet Take 1 tablet (25 mg total) by mouth every 8 (eight) hours.   No facility-administered encounter medications on file as of 02/22/2019.    PHYSICAL EXAM:   General: Weak, chronically ill, confused, frail appearing, thin Cardiovascular: regular  rate and rhythm Pulmonary: clear ant fields Abdomen:  soft, nontender, + bowel sounds Extremities: no edema, no joint deformities/muscle wasting Neurological: functional quadriplegic  Peyten Punches Ihor Gully, NP

## 2019-02-24 ENCOUNTER — Encounter: Payer: Self-pay | Admitting: Nurse Practitioner

## 2019-02-24 ENCOUNTER — Non-Acute Institutional Stay: Payer: Medicare Other | Admitting: Nurse Practitioner

## 2019-02-24 DIAGNOSIS — Z515 Encounter for palliative care: Secondary | ICD-10-CM

## 2019-02-24 DIAGNOSIS — C9110 Chronic lymphocytic leukemia of B-cell type not having achieved remission: Secondary | ICD-10-CM

## 2019-02-24 NOTE — Progress Notes (Addendum)
[name] Consult Note Telephone: [phone]  Fax: [phone]  PATIENT NAME: Frank Owens DOB: 1920-01-06 MRN: [id number]   PRIMARY CARE PROVIDER:Dr Yves Dill PROVIDER:Dr Hodges/[place] [phone]  RECOMMENDATIONS and PLAN: 1.ACP: DNR, medical goals focus onno feeding tube, DNR, do not hospitalize,  wishes are for antibiotics, blood transfusion, diagnostic testing, IV fluids, intubation, lab testing.  2.Anorexia;secondary to protein calorie malnutrition improving reflective of weight gain. Continue to monitor daily weights, supplements, supportive measures and courage to eat  3.Palliative care encounter; Palliative medicine team will continue to support patient, patient's family, and medical team. Visit consisted of counseling and education dealing with the complex and emotionally intense issues of symptom management and palliative care in the setting of serious and potentially life-threatening illness  I spent 45 minutes providing this consultation,  from 10:30am to 11:15am. More than 50% of the time in this consultation was spent coordinating communication.   HISTORY OF PRESENT ILLNESS:  Frank Owens is a 86 y.o. year old male with multiple medical problems including CLL, hypertension, diabetes, memory deficits.[facility]. Frank Owens continues to overall decline, increase weakness, fatigue and sleeping more / staff. Frank Owens is ADL dependent now requires lift transfer for mobility and incontinence. Frank Owens does require more assistance with feeding and continues to appear to lose weight so no current weight documented. Staff endorses continues with increase in more confusion. At present Frank Owens is lying in bed. Frank Owens does appear pale, week,  no distress. I visited and observed Frank Owens. Frank Owens did make eye contact to verbal cues but return to sleep. Frank Owens was cooperative with [place] estimate. Emotional support provided. I called Frank Owens, Frank Owens niece, Health Care power-of-attorney. Clinical update discussed. We talked about purpose of palliative care visit. We talked about Frank Owens overall decline and ability in the setting is CLL with increase in white blood cell count. We talked about his overall decline. We talked about appetite being poor in what appears to be significant weight loss with muscle wasting and sunken cheeks. We talked about medical goals of care which focus on Comfort. We talked about option of Hospice Services being a Medicare benefit. We talked about what services hospice would offer. Frank Owens and agreement to proceed with Hospice Services at the facility with focus on Comfort. We talked about role of palliative care and plan of care. Therapeutic listening and emotional support provided. Contact information.  Recent Labs repeated 2 / 8 / 2021 WBC 96.1, hemoglobin 10.9, hematocrit 34.4, platelets 145 in the setting of CLL. No recent falls, wounds, hospitalizations.  Palliative Care was asked to help to continue to address goals of care.   CODE STATUS: DNR  PPS: 30% HOSPICE ELIGIBILITY/DIAGNOSIS: TBD  PAST MEDICAL HISTORY:  Past Medical History:  Diagnosis Date  . CLL (chronic lymphocytic leukemia) ([place])   . Diabetes mellitus without complication ([place])   . Hypertension     SOCIAL HX:  Social History   Tobacco Use  . Smoking status: Never Smoker  . Smokeless tobacco: Never Used  Substance Use Topics  . Alcohol use: No    ALLERGIES: No Known Allergies   PERTINENT MEDICATIONS:  Outpatient Encounter Medications as of 02/24/2019  Medication Sig  . acetaminophen (TYLENOL) 325 MG tablet Take 650 mg by mouth every 4 (four) hours as needed.  . bisacodyl (DULCOLAX) 5  MG EC tablet Take 10 mg by mouth  daily as needed for moderate constipation.  . carvedilol (COREG) 12.5 MG tablet Take 12.5 mg by mouth 2 (two) times a day.  . hydrALAZINE (APRESOLINE) 25 MG tablet Take 1 tablet (25 mg total) by mouth every 8 (eight) hours.   No facility-administered encounter medications on file as of 02/24/2019.    PHYSICAL EXAM:   General: chronically ill, frail appearing, thin, weak, pale male Cardiovascular: regular rate and rhythm Pulmonary: clear ant fields Abdomen: soft, nontender, + bowel sounds Extremities: no edema, no joint deformities; +muscle wasting Neurological: functionally quadriplegic  Phinley Schall Ihor Gully, NP

## 2019-02-27 ENCOUNTER — Other Ambulatory Visit: Payer: Self-pay

## 2019-07-13 DEATH — deceased
